# Patient Record
Sex: Female | Born: 1960
Health system: Southern US, Community
[De-identification: ages and names within clinical notes are randomized; demographics above are authoritative.]

## PROBLEM LIST (undated history)

## (undated) DIAGNOSIS — I10 Essential (primary) hypertension: Secondary | ICD-10-CM

## (undated) DIAGNOSIS — M722 Plantar fascial fibromatosis: Secondary | ICD-10-CM

## (undated) DIAGNOSIS — M1990 Unspecified osteoarthritis, unspecified site: Secondary | ICD-10-CM

## (undated) DIAGNOSIS — J45909 Unspecified asthma, uncomplicated: Secondary | ICD-10-CM

## (undated) HISTORY — DX: Unspecified osteoarthritis, unspecified site: M19.90

## (undated) HISTORY — DX: Plantar fascial fibromatosis: M72.2

## (undated) HISTORY — PX: JOINT REPLACEMENT: SHX530

---

## 2010-08-04 ENCOUNTER — Emergency Department: Payer: Self-pay | Admitting: Emergency Medicine

## 2010-10-06 ENCOUNTER — Emergency Department: Payer: Self-pay | Admitting: Internal Medicine

## 2011-01-10 ENCOUNTER — Ambulatory Visit: Payer: Self-pay

## 2011-02-06 ENCOUNTER — Emergency Department: Payer: Self-pay | Admitting: Internal Medicine

## 2012-11-20 ENCOUNTER — Emergency Department: Payer: Self-pay | Admitting: Emergency Medicine

## 2012-11-20 LAB — BASIC METABOLIC PANEL
Anion Gap: 7 (ref 7–16)
BUN: 7 mg/dL (ref 7–18)
Chloride: 106 mmol/L (ref 98–107)
Co2: 27 mmol/L (ref 21–32)
Creatinine: 0.53 mg/dL — ABNORMAL LOW (ref 0.60–1.30)
EGFR (African American): >60
EGFR (Non-African Amer.): >60
Osmolality: 277 (ref 275–301)

## 2012-11-20 LAB — CBC
HCT: 36.8 % (ref 35.0–47.0)
HGB: 12.4 g/dL (ref 12.0–16.0)
MCH: 28.1 pg (ref 26.0–34.0)
MCHC: 33.7 g/dL (ref 32.0–36.0)
MCV: 84 fL (ref 80–100)
Platelet: 225 10*3/uL (ref 150–440)
WBC: 7 10*3/uL (ref 3.6–11.0)

## 2013-08-18 ENCOUNTER — Emergency Department: Payer: Self-pay | Admitting: Internal Medicine

## 2013-08-18 LAB — CBC
HCT: 37.6 % (ref 35.0–47.0)
HGB: 12.6 g/dL (ref 12.0–16.0)
MCH: 27.7 pg (ref 26.0–34.0)
MCHC: 33.5 g/dL (ref 32.0–36.0)
MCV: 83 fL (ref 80–100)
Platelet: 229 10*3/uL (ref 150–440)
RBC: 4.54 10*6/uL (ref 3.80–5.20)
RDW: 15.1 % — ABNORMAL HIGH (ref 11.5–14.5)
WBC: 6.6 10*3/uL (ref 3.6–11.0)

## 2013-08-18 LAB — COMPREHENSIVE METABOLIC PANEL
ALT: 15 U/L (ref 12–78)
Albumin: 3.8 g/dL (ref 3.4–5.0)
Alkaline Phosphatase: 69 U/L
Anion Gap: 3 — ABNORMAL LOW (ref 7–16)
BUN: 6 mg/dL — ABNORMAL LOW (ref 7–18)
Bilirubin,Total: 0.3 mg/dL (ref 0.2–1.0)
CALCIUM: 9 mg/dL (ref 8.5–10.1)
CO2: 31 mmol/L (ref 21–32)
CREATININE: 0.55 mg/dL — AB (ref 0.60–1.30)
Chloride: 107 mmol/L (ref 98–107)
EGFR (African American): >60
EGFR (Non-African Amer.): >60
Glucose: 83 mg/dL (ref 65–99)
OSMOLALITY: 278 (ref 275–301)
Potassium: 3.6 mmol/L (ref 3.5–5.1)
SGOT(AST): 10 U/L — ABNORMAL LOW (ref 15–37)
SODIUM: 141 mmol/L (ref 136–145)
Total Protein: 7.7 g/dL (ref 6.4–8.2)

## 2013-08-18 LAB — TROPONIN I

## 2013-10-08 ENCOUNTER — Emergency Department: Payer: Self-pay | Admitting: Emergency Medicine

## 2013-10-19 ENCOUNTER — Emergency Department: Payer: Self-pay | Admitting: Emergency Medicine

## 2013-10-19 LAB — CBC WITH DIFFERENTIAL/PLATELET
Basophil #: 0.1 10*3/uL (ref 0.0–0.1)
Basophil %: 0.7 %
EOS ABS: 0.2 10*3/uL (ref 0.0–0.7)
EOS PCT: 1.8 %
HCT: 37.1 % (ref 35.0–47.0)
HGB: 12.5 g/dL (ref 12.0–16.0)
LYMPHS ABS: 2.5 10*3/uL (ref 1.0–3.6)
Lymphocyte %: 24.8 %
MCH: 28.3 pg (ref 26.0–34.0)
MCHC: 33.8 g/dL (ref 32.0–36.0)
MCV: 84 fL (ref 80–100)
MONO ABS: 0.9 x10 3/mm (ref 0.2–0.9)
Monocyte %: 9 %
Neutrophil #: 6.3 10*3/uL (ref 1.4–6.5)
Neutrophil %: 63.7 %
PLATELETS: 273 10*3/uL (ref 150–440)
RBC: 4.43 10*6/uL (ref 3.80–5.20)
RDW: 14.9 % — ABNORMAL HIGH (ref 11.5–14.5)
WBC: 9.9 10*3/uL (ref 3.6–11.0)

## 2013-10-19 LAB — BASIC METABOLIC PANEL
Anion Gap: 3 — ABNORMAL LOW (ref 7–16)
BUN: 8 mg/dL (ref 7–18)
CALCIUM: 9.1 mg/dL (ref 8.5–10.1)
CHLORIDE: 104 mmol/L (ref 98–107)
CO2: 31 mmol/L (ref 21–32)
CREATININE: 0.55 mg/dL — AB (ref 0.60–1.30)
EGFR (Non-African Amer.): >60
GLUCOSE: 85 mg/dL (ref 65–99)
OSMOLALITY: 273 (ref 275–301)
Potassium: 3.7 mmol/L (ref 3.5–5.1)
SODIUM: 138 mmol/L (ref 136–145)

## 2014-05-05 ENCOUNTER — Other Ambulatory Visit: Payer: Self-pay | Admitting: Adult Health

## 2015-03-28 DIAGNOSIS — M79672 Pain in left foot: Secondary | ICD-10-CM | POA: Insufficient documentation

## 2015-03-28 NOTE — ED Notes (Signed)
Patient ambulatory to triage with steady gait, without difficulty or distress noted; pt st plantar fascitis pain to left foot; no injury; +PP, brisk cap refill, W&D, no swelling

## 2015-03-29 ENCOUNTER — Emergency Department
Admission: EM | Admit: 2015-03-29 | Discharge: 2015-03-29 | Discharge status: Against Medical Advice | Payer: Medicaid Other | Attending: Emergency Medicine | Admitting: Emergency Medicine

## 2015-07-23 ENCOUNTER — Encounter: Payer: Self-pay | Admitting: Emergency Medicine

## 2015-07-23 ENCOUNTER — Emergency Department
Admission: EM | Admit: 2015-07-23 | Discharge: 2015-07-23 | Disposition: A | Discharge status: Home / Self Care | Payer: Medicaid Other | Attending: Emergency Medicine | Admitting: Emergency Medicine

## 2015-07-23 DIAGNOSIS — H5500 Unspecified nystagmus: Secondary | ICD-10-CM | POA: Insufficient documentation

## 2015-07-23 DIAGNOSIS — R110 Nausea: Secondary | ICD-10-CM | POA: Diagnosis not present

## 2015-07-23 DIAGNOSIS — R42 Dizziness and giddiness: Secondary | ICD-10-CM | POA: Diagnosis present

## 2015-07-23 DIAGNOSIS — Z760 Encounter for issue of repeat prescription: Secondary | ICD-10-CM | POA: Diagnosis not present

## 2015-07-23 DIAGNOSIS — I10 Essential (primary) hypertension: Secondary | ICD-10-CM | POA: Insufficient documentation

## 2015-07-23 DIAGNOSIS — Z87891 Personal history of nicotine dependence: Secondary | ICD-10-CM | POA: Diagnosis not present

## 2015-07-23 DIAGNOSIS — E876 Hypokalemia: Secondary | ICD-10-CM | POA: Diagnosis not present

## 2015-07-23 HISTORY — DX: Unspecified asthma, uncomplicated: J45.909

## 2015-07-23 HISTORY — DX: Essential (primary) hypertension: I10

## 2015-07-23 LAB — URINALYSIS COMPLETE WITH MICROSCOPIC (ARMC ONLY)
BILIRUBIN URINE: NEGATIVE
Bacteria, UA: NONE SEEN
GLUCOSE, UA: NEGATIVE mg/dL
Hgb urine dipstick: NEGATIVE
Ketones, ur: NEGATIVE mg/dL
Leukocytes, UA: NEGATIVE
Nitrite: NEGATIVE
PH: 6 (ref 5.0–8.0)
Protein, ur: NEGATIVE mg/dL
Specific Gravity, Urine: 1.023 (ref 1.005–1.030)

## 2015-07-23 LAB — CBC
HCT: 38.9 % (ref 35.0–47.0)
HEMOGLOBIN: 13 g/dL (ref 12.0–16.0)
MCH: 28.7 pg (ref 26.0–34.0)
MCHC: 33.4 g/dL (ref 32.0–36.0)
MCV: 86 fL (ref 80.0–100.0)
PLATELETS: 223 10*3/uL (ref 150–440)
RBC: 4.53 MIL/uL (ref 3.80–5.20)
RDW: 14.7 % — ABNORMAL HIGH (ref 11.5–14.5)
WBC: 5.4 10*3/uL (ref 3.6–11.0)

## 2015-07-23 LAB — BASIC METABOLIC PANEL
Anion gap: 9 (ref 5–15)
BUN: 10 mg/dL (ref 6–20)
CHLORIDE: 102 mmol/L (ref 101–111)
CO2: 30 mmol/L (ref 22–32)
CREATININE: 0.61 mg/dL (ref 0.44–1.00)
Calcium: 9.6 mg/dL (ref 8.9–10.3)
GFR calc non Af Amer: >60 mL/min (ref >60)
GLUCOSE: 86 mg/dL (ref 65–99)
Potassium: 3.2 mmol/L — ABNORMAL LOW (ref 3.5–5.1)
Sodium: 141 mmol/L (ref 135–145)

## 2015-07-23 MED ORDER — MECLIZINE HCL 25 MG PO TABS
25.0000 mg | ORAL_TABLET | Freq: Once | ORAL | Status: AC
  Administered 2015-07-23: 25 mg via ORAL

## 2015-07-23 MED ORDER — ONDANSETRON 4 MG PO TBDP: 4.0000 mg | ORAL_TABLET | Freq: Three times a day (TID) | ORAL | Status: DC | PRN

## 2015-07-23 MED ORDER — MECLIZINE HCL 25 MG PO TABS: 25.0000 mg | ORAL_TABLET | Freq: Four times a day (QID) | ORAL | Status: DC | PRN

## 2015-07-23 MED ORDER — ONDANSETRON 4 MG PO TBDP
4.0000 mg | ORAL_TABLET | Freq: Once | ORAL | Status: AC
  Administered 2015-07-23: 4 mg via ORAL
  Filled 2015-07-23: qty 1

## 2015-07-23 MED ORDER — MECLIZINE HCL 25 MG PO TABS
ORAL_TABLET | ORAL | Status: AC
  Filled 2015-07-23: qty 1

## 2015-07-23 MED ORDER — POTASSIUM CHLORIDE CRYS ER 20 MEQ PO TBCR
40.0000 meq | EXTENDED_RELEASE_TABLET | Freq: Once | ORAL | Status: AC
  Administered 2015-07-23: 40 meq via ORAL
  Filled 2015-07-23 (×2): qty 2

## 2015-07-23 NOTE — ED Notes (Signed)
Was in [place] yesterday and "got sick".   Patient's blood pressure was elevate had an EKG and a CT Scan.  Diagnosed with vertigo.  Patient was given RX for Meclizine, Zofran, and Amlodipine.  [facility]  And went to have RX's filled.  Medications were too expensive, so patient filled amlodipine.  Today patient feeling dizzy and nauseated.  Patient started blood pressure medications today.

## 2015-07-23 NOTE — ED Notes (Signed)
AAOx3.  Skin warm and dry.  Moving all extremities equally and strong. Ambulates with easy and steady gait.   

## 2015-07-23 NOTE — Discharge Instructions (Signed)
Please return to the emergency department if you develop dizziness, falls, vomiting, fever, numbness tingling or weakness, changes in speech or vision, confusion, or any other symptoms concerning to you.

## 2015-07-23 NOTE — ED Provider Notes (Signed)
[facility] Emergency Department Provider Note  ____________________________________________  Time seen: Approximately 2:59 PM  I have reviewed the triage vital signs and the nursing notes.   HISTORY  Chief Complaint Dizziness and Nausea    HPI Rachel Cross is a 54 y.o. female with a history of untreated hypertension presenting for medication refill. Patient reports that yesterday she was in [place] at the opening of a casino when she got up in the middle the night and had the acute onset of a "room spinning" dizziness associated with nausea but no vomiting. It was worse with movements, especially bending forward. She was unable to walk due to instability from the dizziness so she went to the local emergency department where she had a reportedly reassuring EKG and negative CT head without contrast. She was discharged with amlodipine as her blood pressure was greater than 200, as well as meclizine and anti-emetic because those medications resolved her symptoms in the ER. She filled her antihypertensive prescription in [place]. She traveled back to [place] today and was unable to fill her prescriptions because they are from out of state. Her symptoms are slightly better today, and unchanged in character. She has nausea without vomiting, some positional dizziness, but denies any confusion, changes in vision or speech, numbness tingling or weakness. No recent trauma, headache, chest pain or palpitations, shortness of breath.She does report a history of sinusitis 1 month ago which she treated with "home remedies."   Past Medical History  Diagnosis Date  . Hypertension   . Asthma     There are no active problems to display for this patient.   Past Surgical History  Procedure Laterality Date  . Joint replacement      2 knee surgeries to right knee  . Cesarean section      Current Outpatient Rx  Name  Route  Sig  Dispense  Refill  . meclizine  (ANTIVERT) 25 MG tablet   Oral   Take 1 tablet (25 mg total) by mouth every 6 (six) hours as needed for dizziness.   15 tablet   0   . ondansetron (ZOFRAN ODT) 4 MG disintegrating tablet   Oral   Take 1 tablet (4 mg total) by mouth every 8 (eight) hours as needed for nausea or vomiting.   20 tablet   0     Allergies Review of patient's allergies indicates no known allergies.  No family history on file.  Social History Social History  Substance Use Topics  . Smoking status: Former Games developer  . Smokeless tobacco: None  . Alcohol Use: No    Review of Systems Constitutional: No fever/chills. No lightheadedness. No syncope. Eyes: No visual changes. No double vision or blurred vision. ENT: No sore throat. Cardiovascular: Denies chest pain, palpitations. Respiratory: Denies shortness of breath.  No cough. Gastrointestinal: No abdominal pain.  Positive nausea, no vomiting.  No diarrhea.  No constipation. Genitourinary: Negative for dysuria. Musculoskeletal: Negative for back pain. Skin: Negative for rash. Neurological: Negative for headaches, focal weakness or numbness. Positive for dizziness. No tingling. No changes in speech. No confusion.  10-point ROS otherwise negative.  ____________________________________________   PHYSICAL EXAM:  VITAL SIGNS: ED Triage Vitals  Enc Vitals Group     BP 07/23/15 1208 168/94 mmHg     Pulse Rate 07/23/15 1208 90     Resp 07/23/15 1208 18     Temp 07/23/15 1208 97.9 F (36.6 C)     Temp Source 07/23/15 1208 Oral  SpO2 07/23/15 1208 100 %     Weight 07/23/15 1208 180 lb (81.647 kg)     Height 07/23/15 1208  (1.626 m)     Head Cir --      Peak Flow --      Pain Score 07/23/15 1210 0     Pain Loc --      Pain Edu? --      Excl. in GC? --     Constitutional: Alert and oriented. Well appearing and in no acute distress. Answer question appropriately. Eyes: Conjunctivae are normal.  EOMI. PERRLA.  Head: Atraumatic. No  frontal or maxillary tenderness to palpation. EARS: TMs are clear without bulge, erythema or fluid bilaterally. Canals are clear as well. Nose: No congestion/rhinnorhea. Mouth/Throat: Mucous membranes are moist.  Neck: No stridor.  Supple.  Trachea is midline. No JVD. Cardiovascular: Normal rate, regular rhythm. No murmurs, rubs or gallops.  Respiratory: Normal respiratory effort.  No retractions. Lungs CTAB.  No wheezes, rales or ronchi. Gastrointestinal: Soft and nontender. No distention. No peritoneal signs. Musculoskeletal: No LE edema.  Neurologic: Alert and oriented 3. Speech is clear. Face and smile symmetric. EOMI, PERRLA day. 2 beats of nystagmus which extinguish horizontally with left lateral gaze and mild worsening of symptoms with this.  No pronator drift. 5 out of 5 grip, biceps, triceps, hip flexors, plantar flexion and dorsiflexion. Normal sensation to light touch in the bilateral upper and lower extremities, and face. Normal gait without instability or ataxia. Skin:  Skin is warm, dry and intact. No rash noted. Psychiatric: Mood and affect are normal. Speech and behavior are normal.  Normal judgement.  ____________________________________________   LABS (all labs ordered are listed, but only abnormal results are displayed)  Labs Reviewed  BASIC METABOLIC PANEL - Abnormal; Notable for the following:    Potassium 3.2 (*)    All other components within normal limits  CBC - Abnormal; Notable for the following:    RDW 14.7 (*)    All other components within normal limits  URINALYSIS COMPLETEWITH MICROSCOPIC ([facility] ONLY) - Abnormal; Notable for the following:    Color, Urine YELLOW (*)    APPearance CLEAR (*)    Squamous Epithelial / LPF 0-5 (*)    All other components within normal limits   ____________________________________________  EKG  ED ECG REPORT I, Rockne Menghini, the attending physician, personally viewed and interpreted this ECG.   Date:  07/23/2015  EKG Time: 1223  Rate: 85  Rhythm: normal sinus rhythm  Axis: Normal  Intervals:none  ST&T Change: No ST elevation.  ____________________________________________  RADIOLOGY  No results found.  ____________________________________________   PROCEDURES  Procedure(s) performed: None  Critical Care performed: No ____________________________________________   INITIAL IMPRESSION / ASSESSMENT AND PLAN / ED COURSE  Pertinent labs & imaging results that were available during my care of the patient were reviewed by me and considered in my medical decision making (see chart for details).  54 y.o. female with undertreated hypertension which has improved today after going back on her antihypertensive presenting with vertiginous symptoms that are improved but still present after extensive negative workup reported in an outside emergency Department. On my exam, the patient has minimal nystagmus with leftward gaze, she does not have any ataxia. It is unlikely that she is having an acute stroke and at this time imaging with contrast to evaluate for posterior stroke is not indicated. I will plan to discharge her with [place] her vertigo symptoms. She understands return precautions as well as follow-up instructions.  ____________________________________________  FINAL CLINICAL IMPRESSION(S) / ED DIAGNOSES  Final diagnoses:  Hypokalemia  Vertigo  Encounter for medication refill      NEW MEDICATIONS STARTED DURING THIS VISIT:  New Prescriptions   MECLIZINE (ANTIVERT) 25 MG TABLET    Take 1 tablet (25 mg total) by mouth every 6 (six) hours as needed for dizziness.   ONDANSETRON (ZOFRAN ODT) 4 MG DISINTEGRATING TABLET    Take 1 tablet (4 mg total) by mouth every 8 (eight) hours as needed for nausea or vomiting.     Rockne MenghiniAnne-Caroline Ettore Trebilcock, MD 07/23/15 [id number]

## 2015-08-02 ENCOUNTER — Other Ambulatory Visit: Payer: Self-pay | Admitting: Family Medicine

## 2015-08-02 DIAGNOSIS — Z1231 Encounter for screening mammogram for malignant neoplasm of breast: Secondary | ICD-10-CM

## 2016-05-29 ENCOUNTER — Emergency Department
Admission: EM | Admit: 2016-05-29 | Discharge: 2016-05-29 | Disposition: A | Discharge status: Home / Self Care | Payer: Medicaid Other | Attending: Emergency Medicine | Admitting: Emergency Medicine

## 2016-05-29 ENCOUNTER — Encounter: Payer: Self-pay | Admitting: *Deleted

## 2016-05-29 ENCOUNTER — Emergency Department: Payer: Medicaid Other

## 2016-05-29 DIAGNOSIS — M7581 Other shoulder lesions, right shoulder: Secondary | ICD-10-CM

## 2016-05-29 DIAGNOSIS — J45909 Unspecified asthma, uncomplicated: Secondary | ICD-10-CM | POA: Insufficient documentation

## 2016-05-29 DIAGNOSIS — Y929 Unspecified place or not applicable: Secondary | ICD-10-CM | POA: Diagnosis not present

## 2016-05-29 DIAGNOSIS — Z87891 Personal history of nicotine dependence: Secondary | ICD-10-CM | POA: Insufficient documentation

## 2016-05-29 DIAGNOSIS — Y999 Unspecified external cause status: Secondary | ICD-10-CM | POA: Diagnosis not present

## 2016-05-29 DIAGNOSIS — W010XXA Fall on same level from slipping, tripping and stumbling without subsequent striking against object, initial encounter: Secondary | ICD-10-CM | POA: Diagnosis not present

## 2016-05-29 DIAGNOSIS — I10 Essential (primary) hypertension: Secondary | ICD-10-CM | POA: Diagnosis not present

## 2016-05-29 DIAGNOSIS — Y939 Activity, unspecified: Secondary | ICD-10-CM | POA: Diagnosis not present

## 2016-05-29 DIAGNOSIS — Z79899 Other long term (current) drug therapy: Secondary | ICD-10-CM | POA: Diagnosis not present

## 2016-05-29 DIAGNOSIS — M25511 Pain in right shoulder: Secondary | ICD-10-CM | POA: Diagnosis present

## 2016-05-29 DIAGNOSIS — M75101 Unspecified rotator cuff tear or rupture of right shoulder, not specified as traumatic: Secondary | ICD-10-CM | POA: Diagnosis not present

## 2016-05-29 MED ORDER — KETOROLAC TROMETHAMINE 60 MG/2ML IM SOLN
30.0000 mg | Freq: Once | INTRAMUSCULAR | Status: AC
  Administered 2016-05-29: 30 mg via INTRAMUSCULAR
  Filled 2016-05-29: qty 2

## 2016-05-29 MED ORDER — TRAMADOL HCL 50 MG PO TABS: 50.0000 mg | ORAL_TABLET | Freq: Four times a day (QID) | ORAL | 0 refills | Status: DC | PRN

## 2016-05-29 MED ORDER — MELOXICAM 15 MG PO TABS: 15.0000 mg | ORAL_TABLET | Freq: Every day | ORAL | 0 refills | Status: DC

## 2016-05-29 NOTE — Discharge Instructions (Signed)
Please take medications as prescribed and follow-up with orthopedics in no improvement in 5-7 days. Return to the ER for any worsening symptoms urgent changes in her health.

## 2016-05-29 NOTE — ED Notes (Signed)
Right shoulder pain after trip and fall. Pt alert and oriented X4, active, cooperative, pt in NAD. RR even and unlabored, color WNL.  Ambulatory to room.

## 2016-05-29 NOTE — ED Triage Notes (Signed)
Pt tripped and fell , landed on right shoulder/arm, pt complains of right shoulder arm pain

## 2016-05-29 NOTE — ED Provider Notes (Signed)
[facility]-EMERGENCY DEPARTMENT Provider Note   CSN: [id number] Arrival date & time: 05/29/16  1744     History   Chief Complaint Chief Complaint  Patient presents with  . Shoulder Pain    HPI Rachel BeaverStephanie Cross is a 55 y.o. female presents to the emergency department for evaluation of right shoulder pain. Patient states she had a fall 2 weeks ago landed on her right side. She says continued shoulder pain in the right posterior lateral shoulder. She denies any numbness tingling describes the pain as a deep throbbing ache. Pain is increased with shoulder range of motion denies any increase in pain with neck range of motion. Patient was treated initially by urgent care facility with baclofen, 60 steroid taper. She had no improvement. She did not have any x-rays. She has been taking baclofen and Tylenol with no improvement. Her pain is 8 out of 10. She denies any weakness in the right upper extremity. Pain does not go below the elbow. She denies any neck pain or headache.  HPI  Past Medical History:  Diagnosis Date  . Asthma   . Hypertension     There are no active problems to display for this patient.   Past Surgical History:  Procedure Laterality Date  . CESAREAN SECTION    . JOINT REPLACEMENT     2 knee surgeries to right knee    OB History    No data available       Home Medications    Prior to Admission medications   Medication Sig Start Date End Date Taking? Authorizing Provider  meclizine (ANTIVERT) 25 MG tablet Take 1 tablet (25 mg total) by mouth every 6 (six) hours as needed for dizziness. 07/23/15   Anne-Caroline Sharma CovertNorman, MD  meloxicam (MOBIC) 15 MG tablet Take 1 tablet (15 mg total) by mouth daily. 05/29/16   Evon Slackhomas C Aariyah Sampey, PA-C  ondansetron (ZOFRAN ODT) 4 MG disintegrating tablet Take 1 tablet (4 mg total) by mouth every 8 (eight) hours as needed for nausea or vomiting. 07/23/15   Rockne MenghiniAnne-Caroline Norman, MD  traMADol (ULTRAM) 50 MG tablet Take 1 tablet (50 mg  total) by mouth every 6 (six) hours as needed. 05/29/16   Evon Slackhomas C Ndidi Nesby, PA-C    Family History No family history on file.  Social History Social History  Substance Use Topics  . Smoking status: Former Games developermoker  . Smokeless tobacco: Not on file  . Alcohol use No     Allergies   Review of patient's allergies indicates no known allergies.   Review of Systems Review of Systems  Constitutional: Negative for chills and fever.  HENT: Negative for ear pain and sore throat.   Eyes: Negative for pain and visual disturbance.  Respiratory: Negative for cough and shortness of breath.   Cardiovascular: Negative for chest pain and palpitations.  Gastrointestinal: Negative for abdominal pain and vomiting.  Genitourinary: Negative for dysuria and hematuria.  Musculoskeletal: Positive for arthralgias. Negative for back pain.  Skin: Negative for color change and rash.  Neurological: Negative for seizures and syncope.  All other systems reviewed and are negative.    Physical Exam Updated Vital Signs BP (!) 150/85 (BP Location: Right Arm)   Pulse 93   Temp 98.4 F (36.9 C) (Oral)   Resp 20   Ht 5\' 4"  (1.626 m)   Wt 81.6 kg   LMP 05/16/2016   SpO2 98%   BMI 30.90 kg/m   Physical Exam  Constitutional: She appears well-developed and well-nourished. No distress.  HENT:  Head: Normocephalic and atraumatic.  Eyes: Conjunctivae are normal.  Neck: Normal range of motion. Neck supple.  Cardiovascular: Normal rate and regular rhythm.   No murmur heard. Pulmonary/Chest: Effort normal and breath sounds normal. No respiratory distress.  Abdominal: Soft. There is no tenderness.  Musculoskeletal:  Examination of the cervical spine shows patient has no spinous process tenderness. She has a negative Spurling sign. She has full range of motion of cervical spine.  Examination of the right upper extremity shows patient has a slightly positive Hawkins, impingement sign. She has good internal and  external rotation. She has full active and passive range of motion of the shoulder. She has no weakness with supraspinatus, biceps, triceps, grip strength.  Neurological: She is alert.  Skin: Skin is warm and dry.  Psychiatric: She has a normal mood and affect.  Nursing note and vitals reviewed.    ED Treatments / Results  Labs (all labs ordered are listed, but only abnormal results are displayed) Labs Reviewed - No data to display  EKG  EKG Interpretation None       Radiology Dg Shoulder Right  Result Date: 05/29/2016 CLINICAL DATA:  Right shoulder pain after fall. EXAM: RIGHT SHOULDER - 2+ VIEW COMPARISON:  None. FINDINGS: No fracture or dislocation. Glenohumeral and acromioclavicular joints bases appear preserved. No evidence of calcific tendinitis. Regional soft tissues appear normal. Limited visualization of the adjacent thorax is normal. IMPRESSION: No fracture or dislocation. Electronically Signed   By: Simonne Come M.D.   On: 05/29/2016 19:10    Procedures Procedures (including critical care time)  Medications Ordered in ED Medications  ketorolac (TORADOL) injection 30 mg (30 mg Intramuscular Given 05/29/16 1837)     Initial Impression / Assessment and Plan / ED Course  I have reviewed the triage vital signs and the nursing notes.  Pertinent labs & imaging results that were available during my care of the patient were reviewed by me and considered in my medical decision making (see chart for details).  Clinical Course    55 year old female with continued right shoulder pain. X-ray showed no evidence of acute bony abnormality. Patient has positive impingement signs. She is given a prescription for meloxicam, tramadol. He'll follow-up with orthopedics if no improvement in 7-10 days. Patient did see moderate improvement with Toradol injection, 30 mg IM in the emergency department.  Final Clinical Impressions(s) / ED Diagnoses   Final diagnoses:  Acute pain of  right shoulder  Rotator cuff tendonitis, right    New Prescriptions New Prescriptions   MELOXICAM (MOBIC) 15 MG TABLET    Take 1 tablet (15 mg total) by mouth daily.   TRAMADOL (ULTRAM) 50 MG TABLET    Take 1 tablet (50 mg total) by mouth every 6 (six) hours as needed.     Evon Slack, PA-C 05/29/16 1926    Nita Sickle, MD 05/29/16 [phone]

## 2016-08-21 ENCOUNTER — Encounter: Payer: Self-pay | Admitting: *Deleted

## 2016-08-21 ENCOUNTER — Emergency Department
Admission: EM | Admit: 2016-08-21 | Discharge: 2016-08-21 | Disposition: A | Discharge status: Home / Self Care | Payer: Medicaid Other | Attending: Emergency Medicine | Admitting: Emergency Medicine

## 2016-08-21 DIAGNOSIS — M791 Myalgia: Secondary | ICD-10-CM | POA: Diagnosis not present

## 2016-08-21 DIAGNOSIS — R112 Nausea with vomiting, unspecified: Secondary | ICD-10-CM | POA: Diagnosis not present

## 2016-08-21 DIAGNOSIS — J45909 Unspecified asthma, uncomplicated: Secondary | ICD-10-CM | POA: Diagnosis not present

## 2016-08-21 DIAGNOSIS — I10 Essential (primary) hypertension: Secondary | ICD-10-CM | POA: Insufficient documentation

## 2016-08-21 DIAGNOSIS — R6889 Other general symptoms and signs: Secondary | ICD-10-CM

## 2016-08-21 DIAGNOSIS — R509 Fever, unspecified: Secondary | ICD-10-CM | POA: Insufficient documentation

## 2016-08-21 DIAGNOSIS — Z87891 Personal history of nicotine dependence: Secondary | ICD-10-CM | POA: Insufficient documentation

## 2016-08-21 MED ORDER — HYDROCOD POLST-CPM POLST ER 10-8 MG/5ML PO SUER
5.0000 mL | Freq: Once | ORAL | Status: AC
  Administered 2016-08-21: 5 mL via ORAL
  Filled 2016-08-21: qty 5

## 2016-08-21 MED ORDER — KETOROLAC TROMETHAMINE 60 MG/2ML IM SOLN
60.0000 mg | Freq: Once | INTRAMUSCULAR | Status: AC
  Administered 2016-08-21: 60 mg via INTRAMUSCULAR
  Filled 2016-08-21: qty 2

## 2016-08-21 MED ORDER — IBUPROFEN 600 MG PO TABS: 600.0000 mg | ORAL_TABLET | Freq: Three times a day (TID) | ORAL | 0 refills | Status: DC | PRN

## 2016-08-21 MED ORDER — HYDROCOD POLST-CPM POLST ER 10-8 MG/5ML PO SUER: 5.0000 mL | Freq: Two times a day (BID) | ORAL | 0 refills | Status: DC

## 2016-08-21 MED ORDER — OSELTAMIVIR PHOSPHATE 75 MG PO CAPS: 75.0000 mg | ORAL_CAPSULE | Freq: Two times a day (BID) | ORAL | 0 refills | Status: AC

## 2016-08-21 MED ORDER — ONDANSETRON 4 MG PO TBDP
4.0000 mg | ORAL_TABLET | Freq: Once | ORAL | Status: AC
  Administered 2016-08-21: 4 mg via ORAL
  Filled 2016-08-21: qty 1

## 2016-08-21 MED ORDER — ONDANSETRON HCL 8 MG PO TABS: 8.0000 mg | ORAL_TABLET | Freq: Three times a day (TID) | ORAL | 0 refills | Status: DC | PRN

## 2016-08-21 NOTE — ED Provider Notes (Signed)
[facility] Emergency Department Provider Note   ____________________________________________   First MD Initiated Contact with Patient 08/21/16 1625     (approximate)  I have reviewed the triage vital signs and the nursing notes.   HISTORY  Chief Complaint Fever and Generalized Body Aches    HPI Rachel Cross is a 56 y.o. female patient complaining of fever, bodyaches, chills, and nausea and vomiting. Patient stated onset for 2 days. Patient states symptoms appeared status post exposure to grandson who was diagnosed with flu 3 days ago. Patient denies any diarrhea with this complaint. Patient has not taken a flu shot this season.   Past Medical History:  Diagnosis Date  . Asthma   . Hypertension     There are no active problems to display for this patient.   Past Surgical History:  Procedure Laterality Date  . CESAREAN SECTION    . JOINT REPLACEMENT     2 knee surgeries to right knee    Prior to Admission medications   Medication Sig Start Date End Date Taking? Authorizing Provider  chlorpheniramine-HYDROcodone (TUSSIONEX PENNKINETIC ER) 10-8 MG/5ML SUER Take 5 mLs by mouth 2 (two) times daily. 08/21/16   Joni Reiningonald K Skippy Marhefka, PA-C  ibuprofen (ADVIL,MOTRIN) 600 MG tablet Take 1 tablet (600 mg total) by mouth every 8 (eight) hours as needed. 08/21/16   Joni Reiningonald K Alin Hutchins, PA-C  meclizine (ANTIVERT) 25 MG tablet Take 1 tablet (25 mg total) by mouth every 6 (six) hours as needed for dizziness. 07/23/15   Anne-Caroline Sharma CovertNorman, MD  meloxicam (MOBIC) 15 MG tablet Take 1 tablet (15 mg total) by mouth daily. 05/29/16   Evon Slackhomas C Gaines, PA-C  ondansetron (ZOFRAN ODT) 4 MG disintegrating tablet Take 1 tablet (4 mg total) by mouth every 8 (eight) hours as needed for nausea or vomiting. 07/23/15   Rockne MenghiniAnne-Caroline Norman, MD  ondansetron (ZOFRAN) 8 MG tablet Take 1 tablet (8 mg total) by mouth every 8 (eight) hours as needed for nausea or vomiting. 08/21/16   Joni Reiningonald K  Rushton Early, PA-C  oseltamivir (TAMIFLU) 75 MG capsule Take 1 capsule (75 mg total) by mouth 2 (two) times daily. 08/21/16 08/26/16  Joni Reiningonald K Melvia Matousek, PA-C  traMADol (ULTRAM) 50 MG tablet Take 1 tablet (50 mg total) by mouth every 6 (six) hours as needed. 05/29/16   Evon Slackhomas C Gaines, PA-C    Allergies Patient has no known allergies.  History reviewed. No pertinent family history.  Social History Social History  Substance Use Topics  . Smoking status: Former Games developermoker  . Smokeless tobacco: Never Used  . Alcohol use No    Review of Systems Constitutional: Fevers and chills. Body ache Eyes: No visual changes. ENT: No sore throat. Nasal congestion Cardiovascular: Denies chest pain. Respiratory: Denies shortness of breath. Nonproductive cough Gastrointestinal: No abdominal pain.  No nausea, no vomiting.  No diarrhea.  No constipation. Genitourinary: Negative for dysuria. Musculoskeletal: Negative for back pain. Skin: Negative for rash. Neurological: Positive for headaches, but denies focal weakness or numbness.    ____________________________________________   PHYSICAL EXAM:  VITAL SIGNS: ED Triage Vitals  Enc Vitals Group     BP 08/21/16 1456 (!) 149/84     Pulse Rate 08/21/16 1456 (!) 111     Resp 08/21/16 1456 20     Temp 08/21/16 1456 99.3 F (37.4 C)     Temp Source 08/21/16 1456 Oral     SpO2 08/21/16 1456 100 %     Weight 08/21/16 1457 180 lb (81.6  kg)     Height --      Head Circumference --      Peak Flow --      Pain Score 08/21/16 1457 8     Pain Loc --      Pain Edu? --      Excl. in GC? --     Constitutional: Alert and oriented. Well appearing and in no acute distress. Eyes: Conjunctivae are normal. PERRL. EOMI. Head: Atraumatic. Nose:Edematous nasal turbinates with clear rhinorrhea. Bilateral maxillary guarding.. Mouth/Throat: Mucous membranes are moist.  Oropharynx non-erythematous. Neck: No stridor.  No cervical spine tenderness to  palpation. Hematological/Lymphatic/Immunilogical: No cervical lymphadenopathy. Cardiovascular: Normal rate, regular rhythm. Grossly normal heart sounds.  Good peripheral circulation. Respiratory: Normal respiratory effort.  No retractions. Lungs CTAB. Gastrointestinal: Soft and nontender. No distention. No abdominal bruits. No CVA tenderness. Musculoskeletal: No lower extremity tenderness nor edema.  No joint effusions. Neurologic:  Normal speech and language. No gross focal neurologic deficits are appreciated. No gait instability. Skin:  Skin is warm, dry and intact. No rash noted. Psychiatric: Mood and affect are normal. Speech and behavior are normal.  ____________________________________________   LABS (all labs ordered are listed, but only abnormal results are displayed)  Labs Reviewed - No data to display ____________________________________________  EKG   ____________________________________________  RADIOLOGY   ____________________________________________   PROCEDURES  Procedure(s) performed: None  Procedures  Critical Care performed: No  ____________________________________________   INITIAL IMPRESSION / ASSESSMENT AND PLAN / ED COURSE  Pertinent labs & imaging results that were available during my care of the patient were reviewed by me and considered in my medical decision making (see chart for details).  Viral illness status post positive flu exposure. Patient given discharge care instructions. Patient given a prescription for Tamiflu, ibuprofen, Zofran, and Tussionex. Patient advised to follow up with family doctor condition persists.  Clinical Course      ____________________________________________   FINAL CLINICAL IMPRESSION(S) / ED DIAGNOSES  Final diagnoses:  Flu-like symptoms      NEW MEDICATIONS STARTED DURING THIS VISIT:  Discharge Medication List as of 08/21/2016  4:48 PM    START taking these medications   Details   chlorpheniramine-HYDROcodone (TUSSIONEX PENNKINETIC ER) 10-8 MG/5ML SUER Take 5 mLs by mouth 2 (two) times daily., Starting Wed 08/21/2016, Print    ibuprofen (ADVIL,MOTRIN) 600 MG tablet Take 1 tablet (600 mg total) by mouth every 8 (eight) hours as needed., Starting Wed 08/21/2016, Print    ondansetron (ZOFRAN) 8 MG tablet Take 1 tablet (8 mg total) by mouth every 8 (eight) hours as needed for nausea or vomiting., Starting Wed 08/21/2016, Print    oseltamivir (TAMIFLU) 75 MG capsule Take 1 capsule (75 mg total) by mouth 2 (two) times daily., Starting Wed 08/21/2016, Until Mon 08/26/2016, Print         Note:  This document was prepared using Dragon voice recognition software and may include unintentional dictation errors.    Joni Reining, PA-C 08/21/16 1911    Myrna Blazer, MD 08/21/16 [phone]

## 2016-08-21 NOTE — ED Triage Notes (Signed)
Pt to ed with c/o fever, body aches and chills x 2 days.

## 2017-07-20 IMAGING — CR DG SHOULDER 2+V*R*
1 series · 3 of 3 positions shown · non-contrast
Comparison: None.

CLINICAL DATA: Right shoulder pain after fall.

EXAM:
RIGHT SHOULDER - 2+ VIEW

[Series 1: dg shoulder right · 0.14mm/px · 3 of 3 slices shown]
[im 1/3]
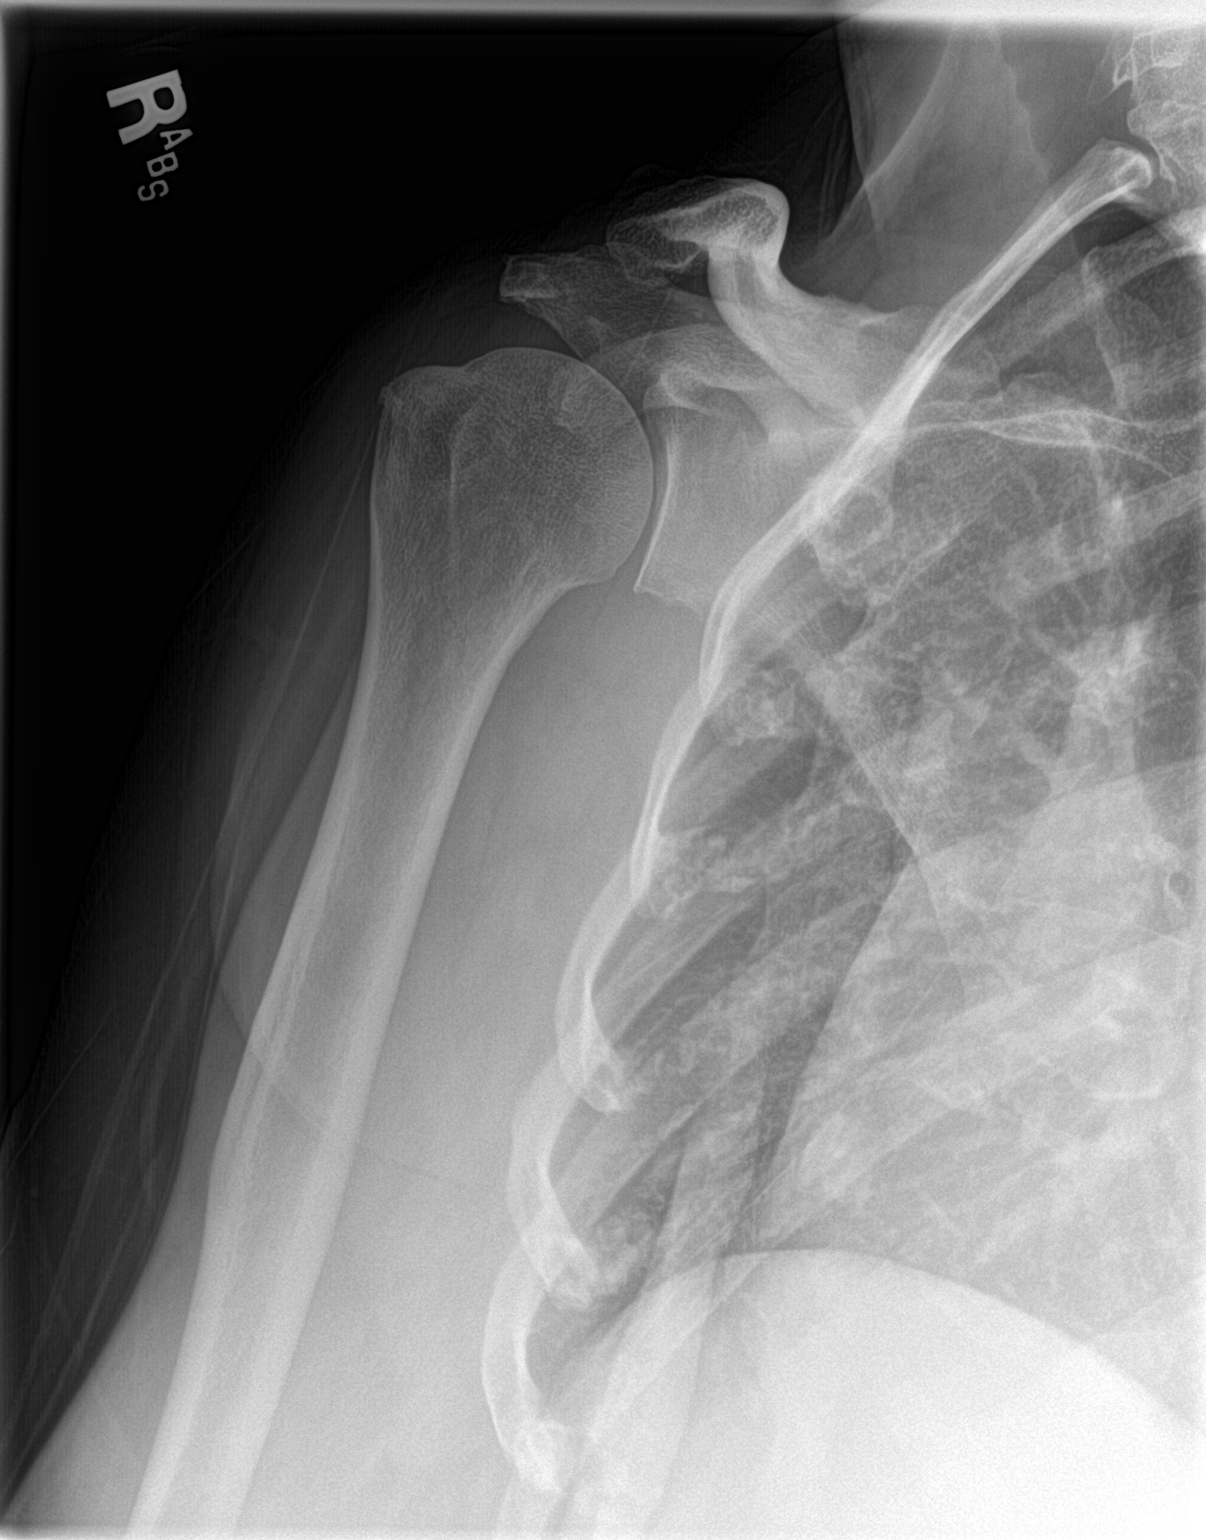
[im 2/3]
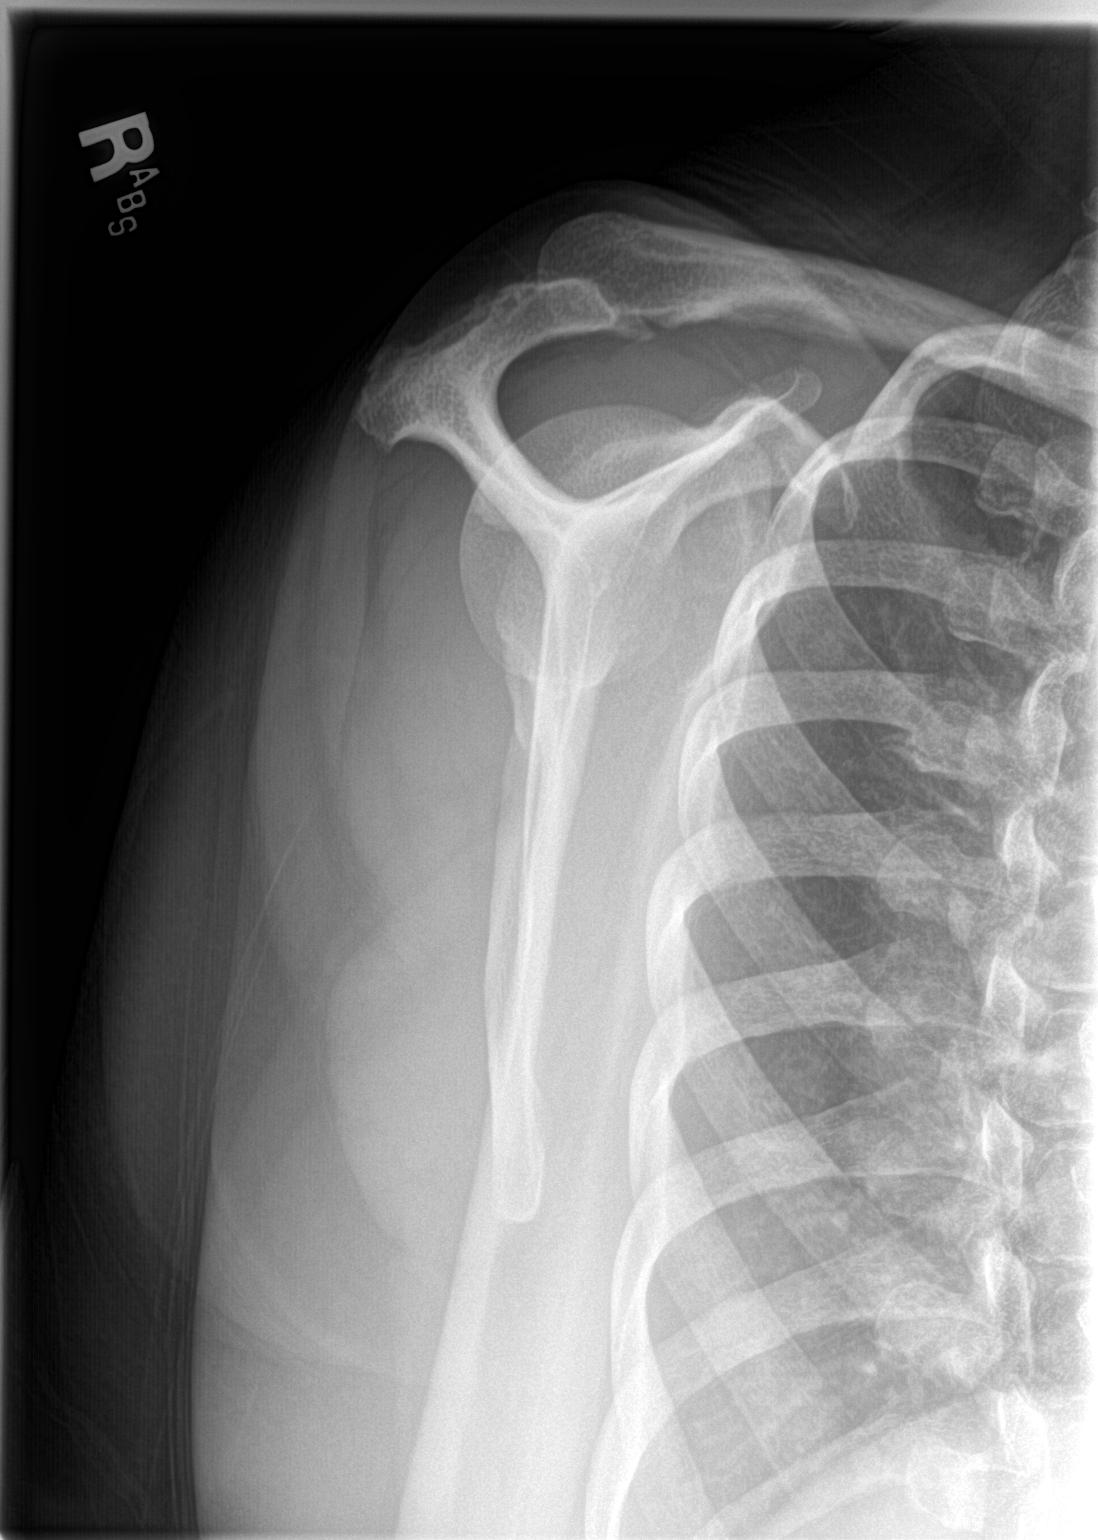
[im 3/3]
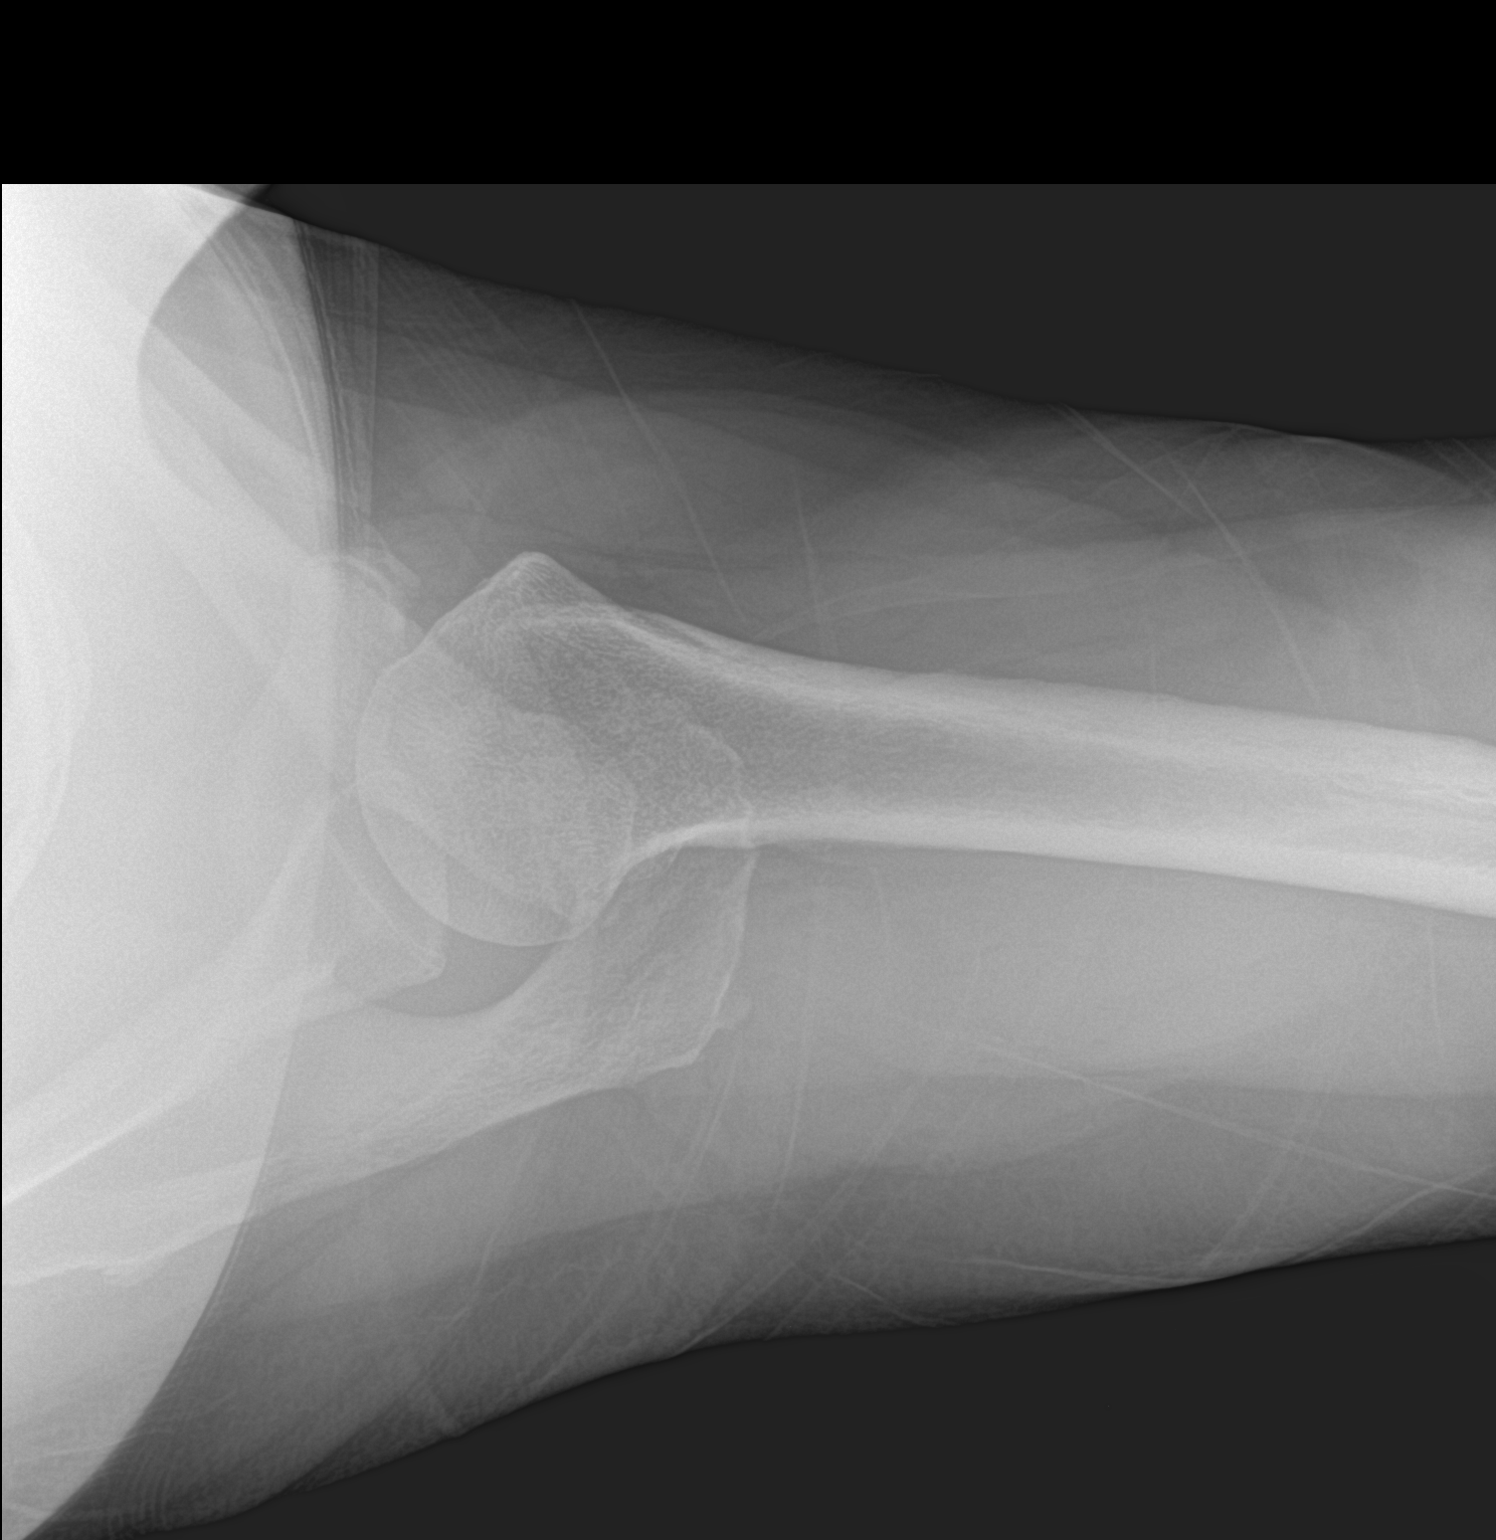

[3 of 3 positions shown; findings below may reference images not displayed]

FINDINGS: No fracture or dislocation. Glenohumeral and acromioclavicular
joints bases appear preserved. No evidence of calcific tendinitis.
Regional soft tissues appear normal. Limited visualization of the
adjacent thorax is normal.
IMPRESSION: No fracture or dislocation.

## 2018-01-02 ENCOUNTER — Other Ambulatory Visit: Payer: Self-pay

## 2018-01-02 DIAGNOSIS — J45909 Unspecified asthma, uncomplicated: Secondary | ICD-10-CM | POA: Insufficient documentation

## 2018-01-02 DIAGNOSIS — K0381 Cracked tooth: Secondary | ICD-10-CM | POA: Insufficient documentation

## 2018-01-02 DIAGNOSIS — Z87891 Personal history of nicotine dependence: Secondary | ICD-10-CM | POA: Insufficient documentation

## 2018-01-02 DIAGNOSIS — Z96651 Presence of right artificial knee joint: Secondary | ICD-10-CM | POA: Insufficient documentation

## 2018-01-02 DIAGNOSIS — Z79899 Other long term (current) drug therapy: Secondary | ICD-10-CM | POA: Insufficient documentation

## 2018-01-02 DIAGNOSIS — I10 Essential (primary) hypertension: Secondary | ICD-10-CM | POA: Insufficient documentation

## 2018-01-02 NOTE — ED Triage Notes (Signed)
Patient states tooth broke while eating.  Pain to left lower jaw.

## 2018-01-02 NOTE — ED Notes (Signed)
Patient to ER for a "cracked tooth." Patient is having significant pain "so that she can't even open her mouth to talk."

## 2018-01-03 ENCOUNTER — Emergency Department
Admission: EM | Admit: 2018-01-03 | Discharge: 2018-01-03 | Disposition: A | Discharge status: Home / Self Care | Payer: Self-pay | Attending: Emergency Medicine | Admitting: Emergency Medicine

## 2018-01-03 DIAGNOSIS — K0381 Cracked tooth: Secondary | ICD-10-CM

## 2018-01-03 MED ORDER — IBUPROFEN 800 MG PO TABS
800.0000 mg | ORAL_TABLET | Freq: Once | ORAL | Status: AC
  Administered 2018-01-03: 800 mg via ORAL
  Filled 2018-01-03: qty 1

## 2018-01-03 MED ORDER — HYDROCODONE-ACETAMINOPHEN 5-325 MG PO TABS: 1.0000 | ORAL_TABLET | Freq: Four times a day (QID) | ORAL | 0 refills | Status: DC | PRN

## 2018-01-03 MED ORDER — BUPIVACAINE HCL (PF) 0.5 % IJ SOLN
INTRAMUSCULAR | Status: AC
  Filled 2018-01-03: qty 30

## 2018-01-03 MED ORDER — IBUPROFEN 600 MG PO TABS: 600.0000 mg | ORAL_TABLET | Freq: Three times a day (TID) | ORAL | 0 refills | Status: DC | PRN

## 2018-01-03 MED ORDER — BUPIVACAINE HCL 0.5 % IJ SOLN
50.0000 mL | Freq: Once | INTRAMUSCULAR | Status: AC
  Administered 2018-01-03: 50 mL

## 2018-01-03 NOTE — ED Provider Notes (Signed)
[facility] Emergency Department Provider Note  ____________________________________________   First MD Initiated Contact with Patient 01/03/18 [phone]     (approximate)  I have reviewed the triage vital signs and the nursing notes.   HISTORY  Chief Complaint No chief complaint on file.    HPI Rachel Cross is a 57 y.o. female who comes to the emergency department with sudden onset severe left lower dental pain after eating dinner tonight and cracking a tooth.  She has not been to a dentist in "a long time".  Pain is severe throbbing aching and radiates from her left jaw to her left ear.  No shortness of breath.  No drooling.  Past Medical History:  Diagnosis Date  . Asthma   . Hypertension     There are no active problems to display for this patient.   Past Surgical History:  Procedure Laterality Date  . CESAREAN SECTION    . JOINT REPLACEMENT     2 knee surgeries to right knee    Prior to Admission medications   Medication Sig Start Date End Date Taking? Authorizing Provider  chlorpheniramine-HYDROcodone (TUSSIONEX PENNKINETIC ER) 10-8 MG/5ML SUER Take 5 mLs by mouth 2 (two) times daily. 08/21/16   Joni Reining, PA-C  HYDROcodone-acetaminophen (NORCO) 5-325 MG tablet Take 1 tablet by mouth every 6 (six) hours as needed for up to 7 doses for severe pain. 01/03/18   Merrily Brittle, MD  ibuprofen (ADVIL,MOTRIN) 600 MG tablet Take 1 tablet (600 mg total) by mouth every 8 (eight) hours as needed. 01/03/18   Merrily Brittle, MD  meclizine (ANTIVERT) 25 MG tablet Take 1 tablet (25 mg total) by mouth every 6 (six) hours as needed for dizziness. 07/23/15   Rockne Menghini, MD  meloxicam (MOBIC) 15 MG tablet Take 1 tablet (15 mg total) by mouth daily. 05/29/16   Evon Slack, PA-C  ondansetron (ZOFRAN ODT) 4 MG disintegrating tablet Take 1 tablet (4 mg total) by mouth every 8 (eight) hours as needed for nausea or vomiting. 07/23/15   Rockne Menghini, MD  ondansetron (ZOFRAN) 8 MG tablet Take 1 tablet (8 mg total) by mouth every 8 (eight) hours as needed for nausea or vomiting. 08/21/16   Joni Reining, PA-C  traMADol (ULTRAM) 50 MG tablet Take 1 tablet (50 mg total) by mouth every 6 (six) hours as needed. 05/29/16   Evon Slack, PA-C    Allergies Patient has no known allergies.  No family history on file.  Social History Social History   Tobacco Use  . Smoking status: Former Games developer  . Smokeless tobacco: Never Used  Substance Use Topics  . Alcohol use: No  . Drug use: Yes    Types: Marijuana    Review of Systems Constitutional: No fever/chills ENT: Positive for dental pain Cardiovascular: Denies chest pain. Respiratory: Denies shortness of breath. Gastrointestinal: No abdominal pain.  No nausea, no vomiting.  No diarrhea.  No constipation. Musculoskeletal: Negative for back pain. Neurological: Negative for headaches   ____________________________________________   PHYSICAL EXAM:  VITAL SIGNS: ED Triage Vitals  Enc Vitals Group     BP 01/02/18 2207 (!) 182/100     Pulse Rate 01/02/18 2207 79     Resp --      Temp 01/02/18 2207 98.2 F (36.8 C)     Temp Source 01/02/18 2207 Oral     SpO2 01/02/18 2207 100 %     Weight 01/02/18 2206 190 lb (86.2 kg)  Height 01/02/18 2206  (1.626 m)     Head Circumference --      Peak Flow --      Pain Score 01/02/18 2206 10     Pain Loc --      Pain Edu? --      Excl. in GC? --     Constitutional: Alert and oriented x4 appears very uncomfortable nontoxic no diaphoresis speaks full clear sentences Head: Atraumatic. Nose: No congestion/rhinnorhea. Mouth/Throat: No trismus uvula midline no pharyngeal erythema or exudate.  Left lower molar cracked and tender Neck: No stridor.   Cardiovascular: Regular rate and rhythm Respiratory: Normal respiratory effort.  No retractions. Neurologic:  Normal speech and language. No gross focal neurologic  deficits are appreciated.  Skin:  Skin is warm, dry and intact. No rash noted.    ____________________________________________  LABS (all labs ordered are listed, but only abnormal results are displayed)  Labs Reviewed - No data to display   __________________________________________  EKG   ____________________________________________  RADIOLOGY   ____________________________________________   DIFFERENTIAL includes but not limited to  Cracked tooth, dental infection, Ludwig angina   PROCEDURES  Procedure(s) performed: Yes  .Nerve Block Date/Time: 01/03/2018 3:00 AM Performed by: Merrily Brittle, MD Authorized by: Merrily Brittle, MD   Consent:    Consent obtained:  Verbal   Consent given by:  Patient   Risks discussed:  Infection, nerve damage, swelling and unsuccessful block Indications:    Indications:  Pain relief Location:    Body area:  Head (Dental block)   Head nerve blocked: Inferior alveolar nerve.   Laterality:  Left Skin anesthesia (see MAR for exact dosages):    Skin anesthesia method:  None Procedure details (see MAR for exact dosages):    Block needle gauge:  25 G   Anesthetic injected:  Bupivacaine 0.5% w/o epi Post-procedure details:    Outcome:  Anesthesia achieved    Critical Care performed: no  Observation: no ____________________________________________   INITIAL IMPRESSION / ASSESSMENT AND PLAN / ED COURSE  Pertinent labs & imaging results that were available during my care of the patient were reviewed by me and considered in my medical decision making (see chart for details).  The patient arrives with an acutely cracked tooth with no evidence of infection.  Verbally consented for dental block and performed with a small amount of bupivacaine without epinephrine with good anesthesia.  I will refer her to a dentist as an outpatient.  Strict return precautions have been given the patient verbalizes understanding agreement the  plan.      ____________________________________________   FINAL CLINICAL IMPRESSION(S) / ED DIAGNOSES  Final diagnoses:  Cracked tooth      NEW MEDICATIONS STARTED DURING THIS VISIT:  Discharge Medication List as of 01/03/2018  3:00 AM    START taking these medications   Details  HYDROcodone-acetaminophen (NORCO) 5-325 MG tablet Take 1 tablet by mouth every 6 (six) hours as needed for up to 7 doses for severe pain., Starting Sat 01/03/2018, Print         Note:  This document was prepared using Dragon voice recognition software and may include unintentional dictation errors.      Merrily Brittle, MD 01/05/18 1044

## 2018-01-03 NOTE — Discharge Instructions (Signed)
Please follow-up with the dentist tomorrow for reevaluation.  Return to the emergency department for any concerns.  It was a pleasure to take care of you today, and thank you for coming to our emergency department.  If you have any questions or concerns before leaving please ask the nurse to grab me and I'm more than happy to go through your aftercare instructions again.  If you were prescribed any opioid pain medication today such as Norco, Vicodin, Percocet, morphine, hydrocodone, or oxycodone please make sure you do not drive when you are taking this medication as it can alter your ability to drive safely.  If you have any concerns once you are home that you are not improving or are in fact getting worse before you can make it to your follow-up appointment, please do not hesitate to call 911 and come back for further evaluation.  Merrily Brittle, MD

## 2020-05-27 ENCOUNTER — Ambulatory Visit: Payer: Self-pay

## 2022-03-12 ENCOUNTER — Encounter: Payer: Self-pay | Admitting: Emergency Medicine

## 2022-03-12 ENCOUNTER — Emergency Department
Admission: EM | Admit: 2022-03-12 | Discharge: 2022-03-12 | Disposition: A | Discharge status: Home / Self Care | Payer: Self-pay | Attending: Emergency Medicine | Admitting: Emergency Medicine

## 2022-03-12 ENCOUNTER — Other Ambulatory Visit: Payer: Self-pay

## 2022-03-12 DIAGNOSIS — I10 Essential (primary) hypertension: Secondary | ICD-10-CM

## 2022-03-12 DIAGNOSIS — Z760 Encounter for issue of repeat prescription: Secondary | ICD-10-CM | POA: Insufficient documentation

## 2022-03-12 DIAGNOSIS — J45909 Unspecified asthma, uncomplicated: Secondary | ICD-10-CM | POA: Insufficient documentation

## 2022-03-12 MED ORDER — AMLODIPINE BESYLATE 10 MG PO TABS: 10.0000 mg | ORAL_TABLET | Freq: Every day | ORAL | 11 refills | Status: DC

## 2022-03-12 MED ORDER — AMLODIPINE BESYLATE 5 MG PO TABS
10.0000 mg | ORAL_TABLET | Freq: Once | ORAL | Status: AC
  Administered 2022-03-12: 10 mg via ORAL
  Filled 2022-03-12: qty 2

## 2022-03-12 NOTE — ED Provider Triage Note (Signed)
  Emergency Medicine Provider Triage Evaluation Note  Rachel Cross , a 61 y.o.female,  was evaluated in triage.  Pt complains of hypertension.  Patient states that she recently ran out of her blood pressure medicine and was feeling sort of " off" yesterday and feeling fatigued.  Denies any chest pain, shortness of breath, or abdominal pain.  She states that she thinks that that she just needs a refill.   Review of Systems  Positive: Elevated blood pressure, fatigue Negative: Denies fever, chest pain, vomiting  Physical Exam  There were no vitals filed for this visit. Gen:   Awake, no distress   Resp:  Normal effort  MSK:   Moves extremities without difficulty  Other:    Medical Decision Making  Given the patient's initial medical screening exam, the following diagnostic evaluation has been ordered. The patient will be placed in the appropriate treatment space, once one is available, to complete the evaluation and treatment. I have discussed the plan of care with the patient and I have advised the patient that an ED physician or mid-level practitioner will reevaluate their condition after the test results have been received, as the results may give them additional insight into the type of treatment they may need.    Diagnostics: EKG.  Treatments: none immediately   Varney Daily, [place] 03/12/22 1546

## 2022-03-12 NOTE — ED Provider Notes (Signed)
   [facility] Provider Note    Event Date/Time   First MD Initiated Contact with Patient 03/12/22 1847     (approximate)  History   Chief Complaint: Headache and Hypertension  HPI  Rachel Cross is a 61 y.o. female with a past medical history of asthma, hypertension, presents to the emergency department for elevated blood pressure.  According to the patient she ran out of her blood pressure medication several days ago since she has noticed a foggy feeling in her head and she was worried that her blood pressure was elevated so she came to the emergency department for evaluation.  Patient denies any chest pain.  Overall the patient appears well, blood pressure upon arrival 161/98.  Physical Exam   Triage Vital Signs: ED Triage Vitals  Enc Vitals Group     BP 03/12/22 1605 (!) 161/98     Pulse Rate 03/12/22 1605 89     Resp 03/12/22 1605 18     Temp 03/12/22 1605 98.4 F (36.9 C)     Temp Source 03/12/22 1605 Oral     SpO2 03/12/22 1605 96 %     Weight 03/12/22 1610 190 lb (86.2 kg)     Height 03/12/22 1610 5\' 4"  (1.626 m)     Head Circumference --      Peak Flow --      Pain Score 03/12/22 1610 0     Pain Loc --      Pain Edu? --      Excl. in GC? --     Most recent vital signs: Vitals:   03/12/22 1605  BP: (!) 161/98  Pulse: 89  Resp: 18  Temp: 98.4 F (36.9 C)  SpO2: 96%    General: Awake, no distress.  CV:  Good peripheral perfusion.  Resp:  Normal effort.  Abd:  No distention.     ED Results / Procedures / Treatments   EKG  EKG viewed and interpreted by myself shows a normal sinus rhythm 86 bpm the narrow QRS, normal axis, normal intervals, no concerning ST changes.  MEDICATIONS ORDERED IN ED: Medications  amLODipine (NORVASC) tablet 10 mg (has no administration in time range)     IMPRESSION / MDM / ASSESSMENT AND PLAN / ED COURSE  I reviewed the triage vital signs and the nursing notes.  Patient's presentation is  most consistent with acute illness / injury with system symptoms.  Patient presents emergency department being out of her blood pressure medicine for the last several days now with head fogginess.  Patient's blood pressure upon arrival 161/98, recheck is 180/98.  We will dose the patient's 10 mg of amlodipine in the emergency department.  Otherwise the patient appears well with no concerning findings, otherwise reassuring vitals.  We will place the patient back on her amlodipine 10 mg daily have the patient follow-up with her primary care doctor.  Patient agreeable to plan of care.  FINAL CLINICAL IMPRESSION(S) / ED DIAGNOSES   Hypertension   Note:  This document was prepared using Dragon voice recognition software and may include unintentional dictation errors.   05/12/22, MD 03/12/22 1900

## 2022-03-12 NOTE — ED Triage Notes (Signed)
Patient arrives by POV ambulatory states she ran out of her BP meds (amlodipine) on Sunday. C/o fatigue, swollen head and light headed onset of yesterday.

## 2022-05-31 ENCOUNTER — Other Ambulatory Visit: Payer: Self-pay

## 2022-05-31 ENCOUNTER — Emergency Department: Payer: Self-pay

## 2022-05-31 ENCOUNTER — Emergency Department
Admission: EM | Admit: 2022-05-31 | Discharge: 2022-05-31 | Disposition: A | Discharge status: Home / Self Care | Payer: Self-pay | Attending: Emergency Medicine | Admitting: Emergency Medicine

## 2022-05-31 DIAGNOSIS — R519 Headache, unspecified: Secondary | ICD-10-CM

## 2022-05-31 DIAGNOSIS — J45909 Unspecified asthma, uncomplicated: Secondary | ICD-10-CM | POA: Insufficient documentation

## 2022-05-31 DIAGNOSIS — I10 Essential (primary) hypertension: Secondary | ICD-10-CM | POA: Insufficient documentation

## 2022-05-31 LAB — BASIC METABOLIC PANEL
Anion gap: 7 (ref 5–15)
BUN: 8 mg/dL (ref 6–20)
CO2: 27 mmol/L (ref 22–32)
Calcium: 9.8 mg/dL (ref 8.9–10.3)
Chloride: 108 mmol/L (ref 98–111)
Creatinine, Ser: 0.61 mg/dL (ref 0.44–1.00)
GFR, Estimated: >60 mL/min (ref >60)
Glucose, Bld: 91 mg/dL (ref 70–99)
Potassium: 3.8 mmol/L (ref 3.5–5.1)
Sodium: 142 mmol/L (ref 135–145)

## 2022-05-31 LAB — CBC WITH DIFFERENTIAL/PLATELET
Abs Immature Granulocytes: 0.01 10*3/uL (ref 0.00–0.07)
Basophils Absolute: 0 10*3/uL (ref 0.0–0.1)
Basophils Relative: 1 %
Eosinophils Absolute: 0.1 10*3/uL (ref 0.0–0.5)
Eosinophils Relative: 2 %
HCT: 41.5 % (ref 36.0–46.0)
Hemoglobin: 13.5 g/dL (ref 12.0–15.0)
Immature Granulocytes: 0 %
Lymphocytes Relative: 43 %
Lymphs Abs: 2.3 10*3/uL (ref 0.7–4.0)
MCH: 27.6 pg (ref 26.0–34.0)
MCHC: 32.5 g/dL (ref 30.0–36.0)
MCV: 84.9 fL (ref 80.0–100.0)
Monocytes Absolute: 0.4 10*3/uL (ref 0.1–1.0)
Monocytes Relative: 8 %
Neutro Abs: 2.5 10*3/uL (ref 1.7–7.7)
Neutrophils Relative %: 46 %
Platelets: 270 10*3/uL (ref 150–400)
RBC: 4.89 MIL/uL (ref 3.87–5.11)
RDW: 14.6 % (ref 11.5–15.5)
WBC: 5.4 10*3/uL (ref 4.0–10.5)
nRBC: 0 % (ref 0.0–0.2)

## 2022-05-31 LAB — TROPONIN I (HIGH SENSITIVITY): Troponin I (High Sensitivity): 5 ng/L (ref <18)

## 2022-05-31 MED ORDER — AMLODIPINE BESYLATE 5 MG PO TABS
10.0000 mg | ORAL_TABLET | Freq: Once | ORAL | Status: AC
  Administered 2022-05-31: 10 mg via ORAL
  Filled 2022-05-31: qty 2

## 2022-05-31 MED ORDER — AMLODIPINE BESYLATE 10 MG PO TABS: 10.0000 mg | ORAL_TABLET | Freq: Every day | ORAL | 11 refills | Status: DC

## 2022-05-31 MED ORDER — IBUPROFEN 400 MG PO TABS
400.0000 mg | ORAL_TABLET | Freq: Once | ORAL | Status: AC
  Administered 2022-05-31: 400 mg via ORAL
  Filled 2022-05-31: qty 1

## 2022-05-31 MED ORDER — ACETAMINOPHEN 500 MG PO TABS
1000.0000 mg | ORAL_TABLET | Freq: Once | ORAL | Status: AC
  Administered 2022-05-31: 1000 mg via ORAL
  Filled 2022-05-31: qty 2

## 2022-05-31 MED ORDER — METOCLOPRAMIDE HCL 10 MG PO TABS
10.0000 mg | ORAL_TABLET | Freq: Once | ORAL | Status: AC
  Administered 2022-05-31: 10 mg via ORAL
  Filled 2022-05-31: qty 1

## 2022-05-31 MED ORDER — AMLODIPINE BESYLATE 10 MG PO TABS: 10.0000 mg | ORAL_TABLET | Freq: Every day | ORAL | 2 refills | Status: DC

## 2022-05-31 NOTE — ED Triage Notes (Signed)
Pt. To ED via POV for HA since last night, blurry vision, and right sided leg swelling. Pt. Denies CP, or SOB or LOC. Pt. States she stopped taking her amlodipine last year.

## 2022-05-31 NOTE — Discharge Instructions (Signed)
Your ultrasound did not show any blood clot in your legs.  Your CAT scan of your brain did not show any significant abnormality.  Please start taking the amlodipine 10 mg daily.  Please check your blood pressure mainly at home so you have a sense for what it is running once you are on the amlodipine and follow-up with your primary doctor as you may need to be started on additional medication.  You can take Tylenol for your headache.  Follow-up any new neurologic symptoms such as numbness or weakness on one side your body please return to the emergency department.

## 2022-05-31 NOTE — ED Provider Triage Note (Signed)
Emergency Medicine Provider Triage Evaluation Note  Rachel Cross , a 61 y.o. female  was evaluated in triage.  Pt complains of Htn. Reports that her right leg is also swollen and she has been needing to use a cane. Reports that she had knee injury in 2004. Reports HA since last night, blurred vision. No CP/SOB. Reprots that she was supposed to be taking BP medication but stopped because "I was fine." She used to take amlodipine, but stopped last year. Reports headache feels throbbing. No head injury. Also reports leg swelling in RLE only for many months. No history of PE/DVT. No skin changes.  Review of Systems  Positive: Headache, blurry vision, leg swelling in RLE Negative: Fever, neck pain, dizzy, abd/back pain  Physical Exam  LMP 11/10/2017 (Approximate)  Gen:   Awake, no distress   Resp:  Normal effort  MSK:   Moves extremities without difficulty  Other:    Medical Decision Making  Medically screening exam initiated at 12:12 PM.  Appropriate orders placed.  Rachel Cross was informed that the remainder of the evaluation will be completed by another provider, this initial triage assessment does not replace that evaluation, and the importance of remaining in the ED until their evaluation is complete.     Marquette Old, PA-C 05/31/22 1218

## 2022-05-31 NOTE — ED Provider Notes (Addendum)
[facility] Provider Note    Event Date/Time   First MD Initiated Contact with Patient 05/31/22 1347     (approximate)   History   Hypertension (Pt. To ED via POV for HA since last night, blurry vision, and right sided leg swelling. Pt. Denies CP, or SOB or LOC. Pt. States she stopped taking her amlodipine last year.) and Leg Swelling   HPI  Rachel Cross is a 61 y.o. female past medical history of asthma and hypertension who presents with headache elevated blood pressure and leg pain.  Patient started having headache today located in the front of her head described as throbbing.  Was gradual in onset not maximal in onset.  Noticed some intermittent blurred vision as well.  Thought her blood pressure to be elevated because of the high blood pressure.  She also endorses pain in the right lower extremity for the last several days denies any preceding trauma.  Notes that she typically has some pain in that leg because of prior surgery but that today it seemed swollen.  Denies any chest pain shortness of breath no numbness tingling weakness no neck pain no trauma.  Patient was on amlodipine in the past but has not been taking it for several years because she felt better.     Past Medical History:  Diagnosis Date   Asthma    Hypertension     There are no problems to display for this patient.    Physical Exam  Triage Vital Signs: ED Triage Vitals  Enc Vitals Group     BP 05/31/22 1216 (!) 190/106     Pulse Rate 05/31/22 1216 87     Resp 05/31/22 1216 18     Temp 05/31/22 1216 98.2 F (36.8 C)     Temp Source 05/31/22 1216 Oral     SpO2 05/31/22 1216 97 %     Weight 05/31/22 1217 210 lb (95.3 kg)     Height 05/31/22 1217 5\' 4"  (1.626 m)     Head Circumference --      Peak Flow --      Pain Score 05/31/22 1217 9     Pain Loc --      Pain Edu? --      Excl. in [place]? --     Most recent vital signs: Vitals:   05/31/22 1422 05/31/22 1423  BP: (!)  186/101   Pulse:  81  Resp:    Temp:    SpO2:  100%     General: Awake, no distress.  CV:  Good peripheral perfusion.  Resp:  Normal effort.  Abd:  No distention.  Neuro:             Awake, Alert, Oriented x 3  Other:  Aox3, nml speech  PERRL, EOMI, face symmetric, nml tongue movement  5/5 strength in the BL upper and lower extremities  Sensation grossly intact in the BL upper and lower extremities  Finger-nose-finger intact BL  No significant swelling of bilateral lower extremities, 2+ DP pulses bilaterally, no focal tenderness of the right lower extremity compartment soft   ED Results / Procedures / Treatments  Labs (all labs ordered are listed, but only abnormal results are displayed) Labs Reviewed  CBC WITH DIFFERENTIAL/PLATELET  BASIC METABOLIC PANEL  TROPONIN I (HIGH SENSITIVITY)     EKG  EKG interpretation performed by myself: NSR, nml axis, nml intervals, no acute ischemic changes, biphasic T waves V4 through V6  RADIOLOGY I reviewed and interpreted the CT scan of the brain which does not show any acute intracranial process    PROCEDURES:  Critical Care performed: No  Procedures    MEDICATIONS ORDERED IN ED: Medications  acetaminophen (TYLENOL) tablet 1,000 mg (1,000 mg Oral Given 05/31/22 1421)  ibuprofen (ADVIL) tablet 400 mg (400 mg Oral Given 05/31/22 1421)  amLODipine (NORVASC) tablet 10 mg (10 mg Oral Given 05/31/22 1421)  metoCLOPramide (REGLAN) tablet 10 mg (10 mg Oral Given 05/31/22 1422)     IMPRESSION / MDM / ASSESSMENT AND PLAN / ED COURSE  I reviewed the triage vital signs and the nursing notes.                              Patient's presentation is most consistent with acute presentation with potential threat to life or bodily function.  Differential diagnosis includes, but is not limited to, asymptomatic hypertension, migraine headache, tension headache, less likely hypertensive emergency, hypertensive encephalopathy,  intracranial hemorrhage, CVA  Patient is a 61 year old female with prior diagnosis of hypertension that is untreated who presents with several issues today.  Started with headache today that was gradual onset not maximal onset associated some blurred vision but no other neurologic symptoms.  She also has had several days of atraumatic right lower extremity pain felt Swollen today.  She has no chest pain or dyspnea.  Blood pressure 190/100 rest of her vitals are reassuring she looks well neurologic exam is nonfocal I do not appreciate any swelling in her lower extremities and she is neurologically and vascularly intact.  CT head was obtained from triage and this is normal chest x-ray is clear Doppler ultrasound the right lower extremity is negative for DVT.  Patient's labs including renal function and blood counts are reassuring.  Ultimately I suspect that this hypertension is asymptomatic will need to be controlled in the long-term.  We will start her back on her amlodipine.  I have low suspicion for acute intracranial process such as cervical artery dissection or cerebral venous sinus thrombosis as a cause of her headache.  Low suspicion for subarachnoid given the nature of the headache and how well she appears.  Do not feel that she needs further imaging at this time.  We will treat supportively with Tylenol and Motrin.  She is otherwise appropriate for discharge.      FINAL CLINICAL IMPRESSION(S) / ED DIAGNOSES   Final diagnoses:  Hypertension, unspecified type  Nonintractable headache, unspecified chronicity pattern, unspecified headache type     Rx / DC Orders   ED Discharge Orders     None        Note:  This document was prepared using Dragon voice recognition software and may include unintentional dictation errors.   Georga Hacking, MD 05/31/22 1413    Georga Hacking, MD 05/31/22 [phone]

## 2022-10-09 ENCOUNTER — Other Ambulatory Visit: Payer: Self-pay

## 2022-10-09 ENCOUNTER — Emergency Department
Admission: EM | Admit: 2022-10-09 | Discharge: 2022-10-09 | Disposition: A | Discharge status: Home / Self Care | Payer: Medicaid Other | Attending: Emergency Medicine | Admitting: Emergency Medicine

## 2022-10-09 ENCOUNTER — Emergency Department: Payer: Medicaid Other

## 2022-10-09 DIAGNOSIS — M5416 Radiculopathy, lumbar region: Secondary | ICD-10-CM

## 2022-10-09 MED ORDER — ACETAMINOPHEN 325 MG PO TABS
650.0000 mg | ORAL_TABLET | Freq: Once | ORAL | Status: AC
  Administered 2022-10-09: 650 mg via ORAL
  Filled 2022-10-09: qty 2

## 2022-10-09 MED ORDER — NAPROXEN 500 MG PO TABS: 500.0000 mg | ORAL_TABLET | Freq: Two times a day (BID) | ORAL | 0 refills | Status: AC

## 2022-10-09 MED ORDER — KETOROLAC TROMETHAMINE 15 MG/ML IJ SOLN
15.0000 mg | Freq: Once | INTRAMUSCULAR | Status: AC
  Administered 2022-10-09: 15 mg via INTRAMUSCULAR
  Filled 2022-10-09: qty 1

## 2022-10-09 MED ORDER — LIDOCAINE 5 % EX PTCH
1.0000 | MEDICATED_PATCH | CUTANEOUS | Status: DC
  Administered 2022-10-09: 1 via TRANSDERMAL
  Filled 2022-10-09: qty 1

## 2022-10-09 MED ORDER — LIDOCAINE 5 % EX PTCH: 1.0000 | MEDICATED_PATCH | Freq: Two times a day (BID) | CUTANEOUS | 0 refills | Status: AC

## 2022-10-09 NOTE — ED Triage Notes (Signed)
Pt comes with c/o slip and fall this am. Pt states right hip and leg pain.

## 2022-10-09 NOTE — ED Provider Notes (Signed)
[facility] Provider Note    Event Date/Time   First MD Initiated Contact with Patient 10/09/22 1238     (approximate)   History   Fall   HPI  Rachel Cross is a 62 y.o. female with no reported past medical history presents today for evaluation of right sided back pain that radiates into her right hip and her right knee.  Patient reports that her foot got stuck while walking on the stairs and pulled behind her.  She did not fall to the ground.  She has been able to ambulate.  She does not take anticoagulation.  She denies weakness in her legs.  She denies urinary or fecal incontinence or retention.  No fevers.  No abdominal pain.  She has not noticed any skin changes.  There are no problems to display for this patient.         Physical Exam   Triage Vital Signs: ED Triage Vitals  Enc Vitals Group     BP 10/09/22 1229 127/82     Pulse Rate 10/09/22 1229 76     Resp 10/09/22 1229 18     Temp 10/09/22 1229 98.1 F (36.7 C)     Temp src --      SpO2 10/09/22 1229 100 %     Weight --      Height --      Head Circumference --      Peak Flow --      Pain Score 10/09/22 1228 8     Pain Loc --      Pain Edu? --      Excl. in [place]? --     Most recent vital signs: Vitals:   10/09/22 1229 10/09/22 1613  BP: 127/82   Pulse: 76 78  Resp: 18   Temp: 98.1 F (36.7 C)   SpO2: 100% 100%    Physical Exam Vitals and nursing note reviewed.  Constitutional:      General: Awake and alert. No acute distress.    Appearance: Normal appearance. The patient is normal weight.  HENT:     Head: Normocephalic and atraumatic.     Mouth: Mucous membranes are moist.  Eyes:     General: PERRL. Normal EOMs        Right eye: No discharge.        Left eye: No discharge.     Conjunctiva/sclera: Conjunctivae normal.  Cardiovascular:     Rate and Rhythm: Normal rate and regular rhythm.     Pulses: Normal pulses.     Heart sounds: Normal heart  sounds Pulmonary:     Effort: Pulmonary effort is normal. No respiratory distress.     Breath sounds: Normal breath sounds.  Abdominal:     Abdomen is soft. There is no abdominal tenderness. No rebound or guarding. No distention. Pelvis stable.  Pain with active range of motion of the hip and knee.  Mild tenderness palpation to the anterior knee, though full and intact range of motion.  No deformity or ecchymosis. Back: No midline tenderness.  Right-sided lumbar paraspinal muscle tenderness.  Strength and sensation 5/5 to bilateral lower extremities. Normal great toe extension against resistance. Normal sensation throughout feet. Normal patellar reflexes.  Positive SLR on the right and opposite SLR bilaterally. Negative FABER test  Musculoskeletal:        General: No swelling. Normal range of motion.     Cervical back: Normal range of motion and neck supple.  Skin:    General: Skin is warm and dry.     Capillary Refill: Capillary refill takes less than 2 seconds.     Findings: No rash.  Neurological:     Mental Status: The patient is awake and alert.      ED Results / Procedures / Treatments   Labs (all labs ordered are listed, but only abnormal results are displayed) Labs Reviewed - No data to display   EKG     RADIOLOGY I independently reviewed and interpreted imaging and agree with radiologists findings.     PROCEDURES:  Critical Care performed:   Procedures   MEDICATIONS ORDERED IN ED: Medications  lidocaine (LIDODERM) 5 % 1 patch (1 patch Transdermal Patch Applied 10/09/22 1346)  ketorolac (TORADOL) 15 MG/ML injection 15 mg (15 mg Intramuscular Given 10/09/22 1345)  acetaminophen (TYLENOL) tablet 650 mg (650 mg Oral Given 10/09/22 1346)     IMPRESSION / MDM / ASSESSMENT AND PLAN / ED COURSE  I reviewed the triage vital signs and the nursing notes.   Differential diagnosis includes, but is not limited to, lumbar radiculopathy, contusion, sprain, ligamental  injury, fracture, dislocation.  Patient is awake and alert, hemodynamically stable and afebrile.  She has intact active and passive range of motion of her hip and knee.  There is no overlying skin changes to suggest hematoma or vascular injury.  There is no swelling or pitting edema noted throughout her extremity.  She is able to ambulate with a steady gait.  She does have pain with straight leg raise, and has tenderness palpation over her right lumbar paraspinal muscles, consistent with either musculoskeletal injury or lumbar radiculopathy.  X-rays obtained demonstrate degenerative changes without acute bony injury. She has 5 out of 5 strength with intact sensation to extensor hallucis dorsiflexion and plantarflexion of bilateral lower extremities with normal patellar reflexes bilaterally. Most likely etiology at this point is muscle strain vs herniated disc. No red flags to indicate patient is at risk for more auspicious process that would require urgent/emergent spinal imaging or subspecialty evaluation at this time. No major trauma, no midline tenderness, no history or physical exam findings to suggest cauda equina syndrome or spinal cord compression. No focal neurological deficits on exam. No constitutional symptoms or history of immunosuppression or IVDA to suggest potential for epidural abscess. Not anticoagulated, no history of bleeding diastasis to suggest risk for epidural hematoma. No chronic steroid use or advanced age or history of malignancy to suggest proclivity towards pathological fracture.  No abdominal pain or flank pain to suggest kidney stone, no history of kidney stone.  No fever or dysuria or CVAT to suggest pyelonephritis .  No chest pain, back pain, shortness of breath, neurological deficits, to suggest vascular catastrophe, and pulses are equal in all 4 extremities.   She was treated symptomatically with Toradol, Tylenol, and Lidoderm patch with significant improvement of her symptoms.   Upon reassessment, she feels improved and ready for discharge.  She requested a prescription for the medications which was provided for her.  We discussed return precautions and the importance of close outpatient follow-up.  Patient understands and agrees with plan.  She was discharged in stable condition.   Patient's presentation is most consistent with acute complicated illness / injury requiring diagnostic workup.   Clinical Course as of 10/09/22 1625  Wed Oct 09, 2022  1528 Patient reports that she feels improved and ready for discharge home [JP]    Clinical Course User Index [JP] Roland Rack, Eliezer Lofts  E, PA-C     FINAL CLINICAL IMPRESSION(S) / ED DIAGNOSES   Final diagnoses:  Lumbar radiculopathy     Rx / DC Orders   ED Discharge Orders          Ordered    naproxen (NAPROSYN) 500 MG tablet  2 times daily with meals        10/09/22 1529    lidocaine (LIDODERM) 5 %  Every 12 hours        10/09/22 1529             Note:  This document was prepared using Dragon voice recognition software and may include unintentional dictation errors.   Emeline Gins 10/09/22 1625    Vanessa Bristol, MD 10/10/22 [phone]

## 2022-10-09 NOTE — Discharge Instructions (Signed)
You may take the medications as prescribed to help with your symptoms.  Please return for any new, worsening, or change in symptoms or other concerns.  It was a pleasure caring for you today.

## 2023-01-22 ENCOUNTER — Other Ambulatory Visit: Payer: Self-pay

## 2023-01-22 ENCOUNTER — Emergency Department
Admission: EM | Admit: 2023-01-22 | Discharge: 2023-01-22 | Disposition: A | Discharge status: Home / Self Care | Payer: Medicaid Other | Attending: Emergency Medicine | Admitting: Emergency Medicine

## 2023-01-22 ENCOUNTER — Emergency Department: Payer: Medicaid Other

## 2023-01-22 DIAGNOSIS — M542 Cervicalgia: Secondary | ICD-10-CM | POA: Diagnosis present

## 2023-01-22 DIAGNOSIS — I10 Essential (primary) hypertension: Secondary | ICD-10-CM | POA: Diagnosis not present

## 2023-01-22 DIAGNOSIS — M436 Torticollis: Secondary | ICD-10-CM | POA: Diagnosis not present

## 2023-01-22 MED ORDER — BACLOFEN 10 MG PO TABS: 10.0000 mg | ORAL_TABLET | Freq: Three times a day (TID) | ORAL | 0 refills | Status: AC

## 2023-01-22 MED ORDER — MELOXICAM 15 MG PO TABS: 15.0000 mg | ORAL_TABLET | Freq: Every day | ORAL | 2 refills | Status: DC

## 2023-01-22 MED ORDER — HYDROCODONE-ACETAMINOPHEN 5-325 MG PO TABS
1.0000 | ORAL_TABLET | Freq: Once | ORAL | Status: AC
  Administered 2023-01-22: 1 via ORAL
  Filled 2023-01-22: qty 1

## 2023-01-22 NOTE — ED Triage Notes (Signed)
Pt presents to the ED due to neck and back pain. Pt states she is unsure if slept wrong or stiff. Pt denies trauma. Pt A&Ox4

## 2023-01-22 NOTE — Discharge Instructions (Signed)
Follow-up with your regular doctor or orthopedics if not improving in 3 to 4 days.  Return emergency department if worsening.  Use medication as prescribed.  Wear the soft collar for comfort.  Apply ice to the posterior neck.  If you use heat use wet heat followed by ice.

## 2023-01-22 NOTE — ED Notes (Signed)
PA-C placed a soft cervical collar on patient for comfort. Patient states it has helped with the pain.

## 2023-01-22 NOTE — ED Provider Triage Note (Signed)
Emergency Medicine Provider Triage Evaluation Note  Rachel Cross , a 62 y.o. female  was evaluated in triage.  Pt complains of L side neck pain. Patient with pain x 2 days. No trauma. No neuro deficits. Pain in L side only.  Review of Systems  Positive: Neck pain Negative: Fever, ha, CP  Physical Exam  BP (!) 172/94 (BP Location: Right Arm)   Pulse 90   Temp 98.3 F (36.8 C) (Oral)   Resp 18   Wt 95.3 kg   LMP 11/10/2017 (Approximate)   SpO2 98%   BMI 36.05 kg/m  Gen:   Awake, no distress   Resp:  Normal effort  MSK:   Moves extremities without difficulty. TTP over the L paraspinal and trap muscle Other:    Medical Decision Making  Medically screening exam initiated at 4:52 PM.  Appropriate orders placed.  GARRETT BOWRING was informed that the remainder of the evaluation will be completed by another provider, this initial triage assessment does not replace that evaluation, and the importance of remaining in the ED until their evaluation is complete.  Julieta Gutting, PA-C 01/22/23 1655

## 2023-01-22 NOTE — ED Provider Notes (Signed)
[facility] Provider Note    Event Date/Time   First MD Initiated Contact with Patient 01/22/23 1758     (approximate)   History   Back Pain and Neck Pain   HPI  Rachel Cross is a 62 y.o. female with history of hypertension presents emergency department complaining of neck pain and stiffness that started 2 days ago.  No known injury.  Pain is mostly on the left side and is reproduced with turning her neck.  States she feels like she needs something to hold her neck up to alleviate pain.  No numbness or tingling in the arms.  No chest pain or shortness of breath      Physical Exam   Triage Vital Signs: ED Triage Vitals  Enc Vitals Group     BP 01/22/23 1649 (!) 172/94     Pulse Rate 01/22/23 1649 90     Resp 01/22/23 1649 18     Temp 01/22/23 1649 98.3 F (36.8 C)     Temp Source 01/22/23 1649 Oral     SpO2 01/22/23 1649 98 %     Weight 01/22/23 1650 210 lb (95.3 kg)     Height --      Head Circumference --      Peak Flow --      Pain Score 01/22/23 1650 10     Pain Loc --      Pain Edu? --      Excl. in [place]? --     Most recent vital signs: Vitals:   01/22/23 1649  BP: (!) 172/94  Pulse: 90  Resp: 18  Temp: 98.3 F (36.8 C)  SpO2: 98%     General: Awake, no distress.   CV:  Good peripheral perfusion. regular rate and  rhythm Resp:  Normal effort.  Abd:  No distention.   Other:  C-spine nontender, large amount of spasms noted in the musculature along the left shoulder and SCM and supraspinatus, grips are equal bilaterally, decreased range of motion of the C-spine secondary to discomfort   ED Results / Procedures / Treatments   Labs (all labs ordered are listed, but only abnormal results are displayed) Labs Reviewed - No data to display   EKG     RADIOLOGY     PROCEDURES:   Procedures   MEDICATIONS ORDERED IN ED: Medications  HYDROcodone-acetaminophen (NORCO/VICODIN) 5-325 MG per tablet 1 tablet (has no  administration in time range)     IMPRESSION / MDM / ASSESSMENT AND PLAN / ED COURSE  I reviewed the triage vital signs and the nursing notes.                              Differential diagnosis includes, but is not limited to, torticollis, bulging disc, fracture  Patient's presentation is most consistent with acute, uncomplicated illness.   The patient has not had an injury so do not feel she has fracture.  She does not have numbness or tingling to indicate disc protrusion.  Her exam is most consistent with torticollis.  Will place her in a soft c-collar which did alleviate the pain.  She will be given a prescription for meloxicam and baclofen.  Follow-up with orthopedics if not improving in 3 to 4 days.  Use alternating heat and ice.  Patient is in agreement treatment plan.  She was discharged stable condition.      FINAL CLINICAL IMPRESSION(S) /  ED DIAGNOSES   Final diagnoses:  Torticollis, acute     Rx / DC Orders   ED Discharge Orders          Ordered    meloxicam (MOBIC) 15 MG tablet  Daily        01/22/23 1810    baclofen (LIORESAL) 10 MG tablet  3 times daily        01/22/23 1810             Note:  This document was prepared using Dragon voice recognition software and may include unintentional dictation errors.    Faythe Ghee, PA-C 01/22/23 1815    Trinna Post, MD 01/22/23 [phone]

## 2023-06-13 ENCOUNTER — Other Ambulatory Visit: Payer: Self-pay

## 2023-06-13 ENCOUNTER — Emergency Department: Payer: Medicaid Other

## 2023-06-13 ENCOUNTER — Emergency Department
Admission: EM | Admit: 2023-06-13 | Discharge: 2023-06-13 | Disposition: A | Discharge status: Home / Self Care | Payer: Medicaid Other | Attending: Emergency Medicine | Admitting: Emergency Medicine

## 2023-06-13 DIAGNOSIS — M25561 Pain in right knee: Secondary | ICD-10-CM | POA: Insufficient documentation

## 2023-06-13 DIAGNOSIS — J45909 Unspecified asthma, uncomplicated: Secondary | ICD-10-CM | POA: Insufficient documentation

## 2023-06-13 DIAGNOSIS — Z760 Encounter for issue of repeat prescription: Secondary | ICD-10-CM | POA: Insufficient documentation

## 2023-06-13 DIAGNOSIS — G8929 Other chronic pain: Secondary | ICD-10-CM | POA: Diagnosis not present

## 2023-06-13 DIAGNOSIS — M7989 Other specified soft tissue disorders: Secondary | ICD-10-CM | POA: Diagnosis not present

## 2023-06-13 DIAGNOSIS — M79604 Pain in right leg: Secondary | ICD-10-CM | POA: Diagnosis present

## 2023-06-13 DIAGNOSIS — I10 Essential (primary) hypertension: Secondary | ICD-10-CM | POA: Diagnosis not present

## 2023-06-13 MED ORDER — IBUPROFEN 800 MG PO TABS
800.0000 mg | ORAL_TABLET | Freq: Once | ORAL | Status: AC
  Administered 2023-06-13: 800 mg via ORAL
  Filled 2023-06-13: qty 1

## 2023-06-13 MED ORDER — MELOXICAM 15 MG PO TABS: 15.0000 mg | ORAL_TABLET | Freq: Every day | ORAL | 2 refills | Status: DC

## 2023-06-13 MED ORDER — AMLODIPINE BESYLATE 10 MG PO TABS: 10.0000 mg | ORAL_TABLET | Freq: Every day | ORAL | 2 refills | Status: DC

## 2023-06-13 NOTE — Discharge Instructions (Addendum)
The ultrasound showed that you do not have a blood clot in your right leg.  I believe the pain and swelling is coming from your arthritis.  Please take the meloxicam once a day for 2 weeks.  You can take 650 mg of Tylenol every 6 hours as needed for pain.  You can ice, elevate and apply heat as you feel is needed.  You can also try topical pain relievers.  I recommend that you get a compressive knee sleeve which will provide additional support and will decrease your swelling.  I would like you to follow-up with the orthopedic provider, Dr. Audelia Acton, call his office to schedule an appointment.    I sent a refill of your amlodipine.  I have also placed a referral for a primary care provider.  Watch for a phone call from the hospital regarding this.  I have also provided a list of local primary care providers that you could reach out to.  Please go to the following website to schedule new (and existing) patient appointments:   [email or web address]   The following is a list of primary care offices in the area who are accepting new patients at this time.  Please reach out to one of them directly and let them know you would like to schedule an appointment to follow up on an Emergency Department visit, and/or to establish a new primary care provider (PCP).  There are likely other primary care clinics in the are who are accepting new patients, but this is an excellent place to start:  [facility] Lead physician: Dr Shirlee Latch [address] [place], [place] [address] [phone]  [facility] Lead Physician: Dr Alba Cory [address], [place], [place] [address] [phone]  [facility]  Lead Physician: Dr Olevia Perches [address] [place], [place] [address] [phone]  [facility] Lead Physician: Dr Sofie Hartigan [address] [place], [place] [address] [phone]  [facility] Primary  Care & Sports Medicine at [facility] Lead Physician: Dr Bari Edward [address] [place], [place], [place] [address] [phone]

## 2023-06-13 NOTE — ED Notes (Signed)
US at bedside

## 2023-06-13 NOTE — ED Provider Notes (Signed)
[facility] Provider Note    Event Date/Time   First MD Initiated Contact with Patient 06/13/23 1240     (approximate)   History   Medication Refill   HPI  Rachel Cross is a 62 y.o. female with PMH of hypertension and asthma who presents for a refill of her amlodipine as well as evaluation for pain and swelling of her right leg.  Patient denies any specific injuries or falls.  She states that the pain in her right leg has been ongoing for a while but has gotten worse in the past 3 days.  She is having difficulty bending and extending her knee, which makes it difficult to walk.  She reports she had knee surgery a few years ago.      Physical Exam   Triage Vital Signs: ED Triage Vitals  Encounter Vitals Group     BP 06/13/23 1142 (!) 144/97     Systolic BP Percentile --      Diastolic BP Percentile --      Pulse Rate 06/13/23 1142 71     Resp 06/13/23 1142 18     Temp 06/13/23 1142 98.2 F (36.8 C)     Temp src --      SpO2 06/13/23 1142 94 %     Weight 06/13/23 1142 210 lb (95.3 kg)     Height 06/13/23 1142 5\' 4"  (1.626 m)     Head Circumference --      Peak Flow --      Pain Score 06/13/23 1141 7     Pain Loc --      Pain Education --      Exclude from Growth Chart --     Most recent vital signs: Vitals:   06/13/23 1142  BP: (!) 144/97  Pulse: 71  Resp: 18  Temp: 98.2 F (36.8 C)  SpO2: 94%    General: Awake, no distress.  CV:  Good peripheral perfusion.  Resp:  Normal effort.  Abd:  No distention.  Other:  Right leg does not appear significantly swollen when compared to the left, no overlying skin changes or bruising.  Pain elicited with compression of the calf muscle, tenderness to palpation surrounding the patella.  Knee flexion ROM limited due to pain.  ROM of hip and ankle maintained.  Dorsalis pedis pulse 2+ regular, sensation intact across all dermatomes.   ED Results / Procedures / Treatments   Labs (all labs  ordered are listed, but only abnormal results are displayed) Labs Reviewed - No data to display   RADIOLOGY  DVT ultrasound of the right leg obtained, interpreted the images as well as reviewed the radiologist report which did not show evidence of a DVT or thrombophlebitis.  PROCEDURES:  Critical Care performed: No  Procedures   MEDICATIONS ORDERED IN ED: Medications  ibuprofen (ADVIL) tablet 800 mg (800 mg Oral Given 06/13/23 1341)     IMPRESSION / MDM / ASSESSMENT AND PLAN / ED COURSE  I reviewed the triage vital signs and the nursing notes.                             62 year old female presents for refill on her amlodipine and also states she is having right leg pain and swelling.  Patient was hypertensive in triage but has not had her medication, vital signs stable otherwise.  Patient NAD on exam.  Differential diagnosis includes, but is  not limited to, medication refill, DVT, arthritis, muscle strain, dependent edema.  Patient's presentation is most consistent with acute complicated illness / injury requiring diagnostic workup.  I refilled patient's amlodipine.  Patient states she just recently got Medicaid coverage so I placed a referral for primary care and provided her with a list of local providers.  In regards to patient's knee pain, I suspect that this is the result of her previously documented severe tricompartmental arthritis, however given her reports of swelling I did think it pertinent to rule out DVT.  Ultrasound was obtained, interpreted the images as well as reviewed the radiologist report which was negative for DVT.  Given the negative imaging I believe her pain is due to her arthritis.  Since she already has previous x-rays that document her arthritis and has not had any new falls or injuries I did not feel it was necessary to repeat the imaging today.  Most of her pain is surrounding the knee, where she is tender to palpation and has some difficulty with range  of motion.  Given the chronic nature of her pain I think it might be time for her to consider corticosteroid injections.  I explained this is not something that we would do in the ED and that she would need to follow-up with an orthopedic provider.  She was given contact information for 1.  I also sent her a prescription for meloxicam and she can take Tylenol as needed.  I further recommended that she wear a compressive knee sleeve this will provide some additional support and may help with her swelling.  She was agreeable to plan, voiced understanding and was stable at discharge.      FINAL CLINICAL IMPRESSION(S) / ED DIAGNOSES   Final diagnoses:  Medication refill  Chronic pain of right knee     Rx / DC Orders   ED Discharge Orders          Ordered    amLODipine (NORVASC) 10 MG tablet  Daily        06/13/23 1529    meloxicam (MOBIC) 15 MG tablet  Daily        06/13/23 1530    Ambulatory Referral to Primary Care (Establish Care)        06/13/23 1532             Note:  This document was prepared using Dragon voice recognition software and may include unintentional dictation errors.   Cameron Ali, PA-C 06/13/23 1555    Chesley Noon, MD 06/14/23 [phone]

## 2023-06-13 NOTE — ED Triage Notes (Signed)
Pt present to ED for refill of amlodipine prescription ran out 4 days.  Pt also complains of pain and swelling in left ankle. Denies any injury.

## 2023-12-29 ENCOUNTER — Other Ambulatory Visit: Payer: Self-pay

## 2023-12-29 ENCOUNTER — Encounter: Payer: Self-pay | Admitting: Emergency Medicine

## 2023-12-29 ENCOUNTER — Emergency Department
Admission: EM | Admit: 2023-12-29 | Discharge: 2023-12-29 | Disposition: A | Discharge status: Home / Self Care | Attending: Emergency Medicine | Admitting: Emergency Medicine

## 2023-12-29 DIAGNOSIS — J45909 Unspecified asthma, uncomplicated: Secondary | ICD-10-CM | POA: Diagnosis not present

## 2023-12-29 DIAGNOSIS — I10 Essential (primary) hypertension: Secondary | ICD-10-CM | POA: Diagnosis not present

## 2023-12-29 DIAGNOSIS — Z760 Encounter for issue of repeat prescription: Secondary | ICD-10-CM | POA: Diagnosis present

## 2023-12-29 MED ORDER — AMLODIPINE BESYLATE 10 MG PO TABS: 10.0000 mg | ORAL_TABLET | Freq: Every day | ORAL | 2 refills | Status: DC

## 2023-12-29 NOTE — ED Triage Notes (Signed)
 Pt to ED via POV for medication refill. Pt needs to get a refill on her amlodipine . Pt states that she does not have a PCP. Pt states that she has been out of her medication for the last 3 days. Pt also reports feeling dizzy and lightheaded and feeling like her "bones are aching".

## 2023-12-29 NOTE — Discharge Instructions (Addendum)
 Can use taking her amlodipine  daily to control your blood pressure.  Also a list of medical clinics are listed on your discharge papers also a referral was placed with the hospital for a primary care provider.  Please go to the following website to schedule new (and existing) patient appointments:   [email or web address]   The following is a list of primary care offices in the area who are accepting new patients at this time.  Please reach out to one of them directly and let them know you would like to schedule an appointment to follow up on an Emergency Department visit, and/or to establish a new primary care provider (PCP).  There are likely other primary care clinics in the are who are accepting new patients, but this is an excellent place to start:  [facility] Lead physician: Dr Aden Agreste [address] [place], [place] [address] [phone]  [facility] Lead Physician: Dr Arleen Lacer [address], [place], [place] [address] [phone]  [facility]  Lead Physician: Dr Terre Ferri [address] [place], [place] [address] [phone]  [facility] Lead Physician: Dr Audrie Blind [address] [place], [place] [address] [phone]  [facility] Primary Care & Sports Medicine at [facility] Lead Physician: Dr Janna Melter [address] [place], [place], [place] [address] [phone]

## 2023-12-29 NOTE — ED Provider Notes (Signed)
 [facility] Provider Note    Event Date/Time   First MD Initiated Contact with Patient 12/29/23 1202     (approximate)   History   Medication Refill   HPI  Rachel Cross is a 63 y.o. female   presents to the ED for medication refill.  Patient states that she needs her amlodipine  for her hypertension, she has been out of medication for 3 days.  She states that she has been coming to the emergency department since prior to COVID and has never had a primary care provider.  She states she has some arthritis but otherwise feels fine.  Patient has history of hypertension and asthma.      Physical Exam   Triage Vital Signs: ED Triage Vitals  Encounter Vitals Group     BP 12/29/23 1213 139/79     Systolic BP Percentile --      Diastolic BP Percentile --      Pulse Rate 12/29/23 1213 75     Resp 12/29/23 1213 19     Temp 12/29/23 1213 97.7 F (36.5 C)     Temp Source 12/29/23 1213 Oral     SpO2 12/29/23 1213 100 %     Weight 12/29/23 1204 209 lb 7 oz (95 kg)     Height 12/29/23 1204 5\' 4"  (1.626 m)     Head Circumference --      Peak Flow --      Pain Score 12/29/23 1204 7     Pain Loc --      Pain Education --      Exclude from Growth Chart --     Most recent vital signs: Vitals:   12/29/23 1213  BP: 139/79  Pulse: 75  Resp: 19  Temp: 97.7 F (36.5 C)  SpO2: 100%     General: Awake, no distress.  Alert, talkative, CV:  Good peripheral perfusion.  Heart regular rate and rhythm. Resp:  Normal effort.  Lungs are clear bilaterally. Abd:  No distention.  Other:     ED Results / Procedures / Treatments   Labs (all labs ordered are listed, but only abnormal results are displayed) Labs Reviewed - No data to display   PROCEDURES:  Critical Care performed:   Procedures   MEDICATIONS ORDERED IN ED: Medications - No data to display   IMPRESSION / MDM / ASSESSMENT AND PLAN / ED COURSE  I reviewed the triage vital signs and  the nursing notes.   Differential diagnosis includes, but is not limited to, hypertension, medication management, need for primary care provider.  63 year old female presents to the ED for refill of her amlodipine  for her blood pressure.  Patient states she has been out of medication for the last 3 days.  She states that all the years that she has been hypertensive she has never had a PCP.  A referral was placed and also a list of clinics was printed on her discharge papers so that she can obtain a PCP.  Prescription for 90-day supply was sent to the pharmacy for her to continue taking amlodipine  on a daily basis.      Patient's presentation is most consistent with acute, uncomplicated illness.  FINAL CLINICAL IMPRESSION(S) / ED DIAGNOSES   Final diagnoses:  Encounter for medication refill     Rx / DC Orders   ED Discharge Orders          Ordered    amLODipine  (NORVASC ) 10 MG tablet  Daily        12/29/23 1218    Ambulatory Referral to Primary Care (Establish Care)       Comments: Hypertension management   12/29/23 1220             Note:  This document was prepared using Dragon voice recognition software and may include unintentional dictation errors.   Stafford Eagles, PA-C 12/29/23 1226    Bryson Carbine, MD 12/29/23 [phone]

## 2024-01-13 ENCOUNTER — Emergency Department

## 2024-01-13 ENCOUNTER — Emergency Department
Admission: EM | Admit: 2024-01-13 | Discharge: 2024-01-13 | Disposition: A | Discharge status: Home / Self Care | Attending: Emergency Medicine | Admitting: Emergency Medicine

## 2024-01-13 DIAGNOSIS — M1711 Unilateral primary osteoarthritis, right knee: Secondary | ICD-10-CM | POA: Diagnosis not present

## 2024-01-13 DIAGNOSIS — M25561 Pain in right knee: Secondary | ICD-10-CM | POA: Diagnosis present

## 2024-01-13 MED ORDER — ACETAMINOPHEN 325 MG PO TABS
650.0000 mg | ORAL_TABLET | Freq: Once | ORAL | Status: AC
  Administered 2024-01-13: 650 mg via ORAL
  Filled 2024-01-13: qty 2

## 2024-01-13 NOTE — ED Triage Notes (Signed)
 Pt pov to ED for right knee pain that started today. Pt reports hx of injury to that knee, but denies recent injury or trauma. Pt reports some splotching to bilateral lower extremities. Pt thinks she may be allergic to meloxicam  that she was started on two weeks ago. Pt A&Ox4 at time of triage.

## 2024-01-13 NOTE — ED Provider Notes (Signed)
 [facility] Provider Note    Event Date/Time   First MD Initiated Contact with Patient 01/13/24 1557     (approximate)   History   Leg Pain   HPI  Rachel Cross is a 63 y.o. female who presents today for evaluation of right lower extremity swelling and pain.  Patient reports that this began today.  She reports that most of her pain is behind her right knee.  She also thinks that her leg is swollen because her pants up left an indent on her affected side but not on her unaffected side.  No chest pain or shortness of breath.  No history of PE or DVT.  There are no active problems to display for this patient.         Physical Exam   Triage Vital Signs: ED Triage Vitals [01/13/24 1445]  Encounter Vitals Group     BP (!) 154/99     Systolic BP Percentile      Diastolic BP Percentile      Pulse Rate 90     Resp 18     Temp 98.5 F (36.9 C)     Temp Source Oral     SpO2 98 %     Weight 220 lb (99.8 kg)     Height 5\' 4"  (1.626 m)     Head Circumference      Peak Flow      Pain Score 10     Pain Loc      Pain Education      Exclude from Growth Chart     Most recent vital signs: Vitals:   01/13/24 1445  BP: (!) 154/99  Pulse: 90  Resp: 18  Temp: 98.5 F (36.9 C)  SpO2: 98%    Physical Exam Vitals and nursing note reviewed.  Constitutional:      General: Awake and alert. No acute distress.    Appearance: Normal appearance.   HENT:     Head: Normocephalic and atraumatic.     Mouth: Mucous membranes are moist.  Eyes:     General: PERRL. Normal EOMs        Right eye: No discharge.        Left eye: No discharge.     Conjunctiva/sclera: Conjunctivae normal.  Cardiovascular:     Rate and Rhythm: Normal rate and regular rhythm.     Pulses: Normal pulses.  Pulmonary:     Effort: Pulmonary effort is normal. No respiratory distress.     Breath sounds: Normal breath sounds.  Abdominal:     Abdomen is soft. There is no abdominal  tenderness. No rebound or guarding. No distention. Musculoskeletal:        General: No swelling. Normal range of motion.     Cervical back: Normal range of motion and neck supple.  No deformity or rash. No joint line tenderness. No patellar tenderness, no ballotment Warm and well perfused extremity with 2+ pedal pulses 5/5 strength to dorsiflexion and plantarflexion at the ankle with intact sensation throughout extremity Limited range of motion of the knee secondary to pain 80 to 120 degrees, with intact flexion and extension to active and passive range of motion. Extensor mechanism intact. No ligamentous laxity. Negative anterior/posterior drawer/negative lachman, negative mcmurrays No effusion or warmth or erythema Intact quadriceps, hamstring function, patellar tendon function Pelvis stable Full ROM of ankle without pain or swelling Foot warm and well perfused Skin:    General: Skin is warm and  dry.     Capillary Refill: Capillary refill takes less than 2 seconds.     Findings: No rash.  Neurological:     Mental Status: The patient is awake and alert.      ED Results / Procedures / Treatments   Labs (all labs ordered are listed, but only abnormal results are displayed) Labs Reviewed - No data to display   EKG     RADIOLOGY I independently reviewed and interpreted imaging and agree with radiologists findings.     PROCEDURES:  Critical Care performed:   Procedures   MEDICATIONS ORDERED IN ED: Medications  acetaminophen  (TYLENOL ) tablet 650 mg (650 mg Oral Given 01/13/24 1628)     IMPRESSION / MDM / ASSESSMENT AND PLAN / ED COURSE  I reviewed the triage vital signs and the nursing notes.   Differential diagnosis includes, but is not limited to, osteoarthritis, DVT, Baker's cyst, dependent edema.  Patient is awake and alert, hemodynamically stable and afebrile.  No skin changes noted to her lower extremities.  She does have swelling to her right lower  extremity and pain to the posterior portion of her leg, will obtain ultrasound.  Patient is able to flex extend her knee but has pain with doing so.  X-ray was obtained in triage, which reveals severe tricompartmental osteoarthritis.  Ultrasound is negative for DVT.  Discussed findings with patient and recommended outpatient follow-up with orthopedics for further management of her tricompartmental osteoarthritis.  The appropriate follow-up information was provided.  We discussed return precautions and outpatient follow-up.  Patient understands and agrees with plan.  She was discharged in stable condition.   Patient's presentation is most consistent with acute complicated illness / injury requiring diagnostic workup.      FINAL CLINICAL IMPRESSION(S) / ED DIAGNOSES   Final diagnoses:  Arthritis of right knee     Rx / DC Orders   ED Discharge Orders     None        Note:  This document was prepared using Dragon voice recognition software and may include unintentional dictation errors.   Haya Hemler E, PA-C 01/13/24 1810    Jacquie Maudlin, MD 01/13/24 [phone]

## 2024-01-13 NOTE — Discharge Instructions (Signed)
 Your ultrasound does not reveal any blood clots.  Your x-ray shows severe osteoarthritis.  You may follow-up with orthopedics for further management.  Please return for any new, worsening, or change in symptoms or other concerns.  Is a pleasure caring for you today.

## 2024-03-05 ENCOUNTER — Ambulatory Visit: Admitting: Nurse Practitioner

## 2024-03-05 ENCOUNTER — Encounter: Payer: Self-pay | Admitting: Nurse Practitioner

## 2024-03-05 VITALS — BP 140/82 | HR 98 | Temp 98.2°F | Resp 18 | Ht 63.72 in | Wt 223.3 lb

## 2024-03-05 DIAGNOSIS — R32 Unspecified urinary incontinence: Secondary | ICD-10-CM

## 2024-03-05 DIAGNOSIS — Z1159 Encounter for screening for other viral diseases: Secondary | ICD-10-CM

## 2024-03-05 DIAGNOSIS — M25569 Pain in unspecified knee: Secondary | ICD-10-CM | POA: Diagnosis not present

## 2024-03-05 DIAGNOSIS — Z114 Encounter for screening for human immunodeficiency virus [HIV]: Secondary | ICD-10-CM

## 2024-03-05 DIAGNOSIS — I10 Essential (primary) hypertension: Secondary | ICD-10-CM | POA: Diagnosis not present

## 2024-03-05 DIAGNOSIS — N951 Menopausal and female climacteric states: Secondary | ICD-10-CM

## 2024-03-05 DIAGNOSIS — Z130 Encounter for screening for diseases of the blood and blood-forming organs and certain disorders involving the immune mechanism: Secondary | ICD-10-CM

## 2024-03-05 DIAGNOSIS — M722 Plantar fascial fibromatosis: Secondary | ICD-10-CM

## 2024-03-05 DIAGNOSIS — Z1231 Encounter for screening mammogram for malignant neoplasm of breast: Secondary | ICD-10-CM

## 2024-03-05 DIAGNOSIS — G8929 Other chronic pain: Secondary | ICD-10-CM

## 2024-03-05 DIAGNOSIS — Z139 Encounter for screening, unspecified: Secondary | ICD-10-CM

## 2024-03-05 DIAGNOSIS — Z1211 Encounter for screening for malignant neoplasm of colon: Secondary | ICD-10-CM

## 2024-03-05 DIAGNOSIS — Z131 Encounter for screening for diabetes mellitus: Secondary | ICD-10-CM

## 2024-03-05 DIAGNOSIS — Z13220 Encounter for screening for lipoid disorders: Secondary | ICD-10-CM

## 2024-03-05 LAB — POCT URINALYSIS DIPSTICK
Bilirubin, UA: NEGATIVE
Blood, UA: NEGATIVE
Glucose, UA: NEGATIVE
Ketones, UA: NEGATIVE
Leukocytes, UA: NEGATIVE
Nitrite, UA: NEGATIVE
Odor: NORMAL
Protein, UA: NEGATIVE
Spec Grav, UA: 1.015 (ref 1.010–1.025)
Urobilinogen, UA: 0.2 U/dL
pH, UA: 6 (ref 5.0–8.0)

## 2024-03-05 MED ORDER — AMLODIPINE BESYLATE 10 MG PO TABS: 10.0000 mg | ORAL_TABLET | Freq: Every day | ORAL | 2 refills | Status: AC

## 2024-03-05 MED ORDER — PROGESTERONE 200 MG PO CAPS: 200.0000 mg | ORAL_CAPSULE | Freq: Every day | ORAL | 1 refills | Status: AC

## 2024-03-05 MED ORDER — ESTRADIOL 0.025 MG/24HR TD PTTW: 1.0000 | MEDICATED_PATCH | TRANSDERMAL | 5 refills | Status: AC
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
## 2024-03-06 LAB — LIPID PANEL
Cholesterol: 223 mg/dL — ABNORMAL HIGH (ref <200)
HDL: 58 mg/dL (ref >=50)
LDL Cholesterol (Calc): 147 mg/dL — ABNORMAL HIGH
Non-HDL Cholesterol (Calc): 165 mg/dL — ABNORMAL HIGH (ref <130)
Total CHOL/HDL Ratio: 3.8 (calc) (ref <5.0)
Triglycerides: 77 mg/dL (ref <150)

## 2024-03-06 LAB — COMPREHENSIVE METABOLIC PANEL WITH GFR
AG Ratio: 1.7 (calc) (ref 1.0–2.5)
ALT: 12 U/L (ref 6–29)
AST: 17 U/L (ref 10–35)
Albumin: 4.6 g/dL (ref 3.6–5.1)
Alkaline phosphatase (APISO): 77 U/L (ref 37–153)
BUN: 8 mg/dL (ref 7–25)
CO2: 31 mmol/L (ref 20–32)
Calcium: 9.9 mg/dL (ref 8.6–10.4)
Chloride: 103 mmol/L (ref 98–110)
Creat: 0.61 mg/dL (ref 0.50–1.05)
Globulin: 2.7 g/dL (ref 1.9–3.7)
Glucose, Bld: 109 mg/dL — ABNORMAL HIGH (ref 65–99)
Potassium: 4.3 mmol/L (ref 3.5–5.3)
Sodium: 143 mmol/L (ref 135–146)
Total Bilirubin: 0.4 mg/dL (ref 0.2–1.2)
Total Protein: 7.3 g/dL (ref 6.1–8.1)
eGFR: 101 mL/min/1.73m2 (ref >=60)

## 2024-03-06 LAB — CBC WITH DIFFERENTIAL/PLATELET
Absolute Lymphocytes: 2690 {cells}/uL (ref 850–3900)
Absolute Monocytes: 390 {cells}/uL (ref 200–950)
Basophils Absolute: 43 {cells}/uL (ref 0–200)
Basophils Relative: 0.7 %
Eosinophils Absolute: 122 {cells}/uL (ref 15–500)
Eosinophils Relative: 2 %
HCT: 39.6 % (ref 35.0–45.0)
Hemoglobin: 12.8 g/dL (ref 11.7–15.5)
MCH: 28.3 pg (ref 27.0–33.0)
MCHC: 32.3 g/dL (ref 32.0–36.0)
MCV: 87.4 fL (ref 80.0–100.0)
MPV: 11.1 fL (ref 7.5–12.5)
Monocytes Relative: 6.4 %
Neutro Abs: 2855 {cells}/uL (ref 1500–7800)
Neutrophils Relative %: 46.8 %
Platelets: 265 Thousand/uL (ref 140–400)
RBC: 4.53 Million/uL (ref 3.80–5.10)
RDW: 13.5 % (ref 11.0–15.0)
Total Lymphocyte: 44.1 %
WBC: 6.1 Thousand/uL (ref 3.8–10.8)

## 2024-03-06 LAB — HEMOGLOBIN A1C
Hgb A1c MFr Bld: 5.8 % — ABNORMAL HIGH (ref <5.7)
Mean Plasma Glucose: 120 mg/dL
eAG (mmol/L): 6.6 mmol/L

## 2024-03-06 LAB — HEPATITIS C ANTIBODY: Hepatitis C Ab: NONREACTIVE

## 2024-03-06 LAB — HIV ANTIBODY (ROUTINE TESTING W REFLEX): HIV 1&2 Ab, 4th Generation: NONREACTIVE

## 2024-03-08 ENCOUNTER — Ambulatory Visit: Payer: Self-pay | Admitting: Nurse Practitioner

## 2024-03-10 ENCOUNTER — Telehealth: Payer: Self-pay

## 2024-03-10 MED ORDER — ROSUVASTATIN CALCIUM 5 MG PO TABS: 5.0000 mg | ORAL_TABLET | Freq: Every day | ORAL | 3 refills | Status: AC

## 2024-03-10 NOTE — Progress Notes (Signed)
 Complex Care Management Note Care Guide Note  03/10/2024 Name: Rachel Cross MRN: [id number] DOB: 02-26-1961   Complex Care Management Outreach Attempts: An unsuccessful telephone outreach was attempted today to offer the patient information about available complex care management services.  Follow Up Plan:  Additional outreach attempts will be made to offer the patient complex care management information and services.   Encounter Outcome:  No Answer  Dream[facility]  [facility] Care Management Assistant Direct Dial: [phone]  Fax: [phone]

## 2024-03-26 LAB — COLOGUARD: COLOGUARD: NEGATIVE

## 2024-04-02 ENCOUNTER — Ambulatory Visit: Admitting: Nurse Practitioner

## 2024-04-02 ENCOUNTER — Encounter: Payer: Self-pay | Admitting: Nurse Practitioner

## 2024-04-02 VITALS — BP 122/84 | HR 88 | Temp 98.0°F | Resp 18 | Ht 63.72 in | Wt 222.7 lb

## 2024-04-02 DIAGNOSIS — N951 Menopausal and female climacteric states: Secondary | ICD-10-CM

## 2024-04-02 DIAGNOSIS — I10 Essential (primary) hypertension: Secondary | ICD-10-CM

## 2024-04-02 NOTE — Progress Notes (Signed)
 BP 122/84   Pulse 88   Temp 98 F (36.7 C)   Resp 18   Ht 5' 3.72 (1.618 m)   Wt 222 lb 11.2 oz (101 kg)   LMP 11/10/2017 (Approximate)   SpO2 100%   BMI 38.56 kg/m    Subjective:    Patient ID: Rachel Cross, female    DOB: Aug 28, 1960, 63 y.o.   MRN: [id number]  HPI: Rachel Cross is a 63 y.o. female  Chief Complaint  Patient presents with   Medical Management of Chronic Issues   Hypertension    Discussed the use of AI scribe software for clinical note transcription with the patient, who gave verbal consent to proceed.  History of Present Illness Rachel Cross is a 63 year old female who presents for a four-week follow-up for blood pressure management.  Hypertension - Four weeks ago, blood pressure measured at 140/82 mmHg - Started on amlodipine  10 mg daily at that time - Currently feels well with no adverse effects reported  Menopausal symptoms - Managing symptoms with hormone replacement therapy (HRT) - Finds HRT helpful - Maintains contact with pharmacy to ensure adequate supply and refills of HRT patches  Knee pain - Experiencing ongoing knee pain - Received a cortisone injection, which initially caused discomfort but subsequently provided some relief - Uncertain about recommended frequency of cortisone injections - Difficulty with stairs, awaiting relocation to a lower floor in her apartment building for health reasons - Provided with a cane and a knee brace; knee brace does not fit well and slides off  Urinary incontinence - History of urinary incontinence - Referred to urology for further evaluation - Received notification for urology appointment but was initially unsure of the reason for referral         03/05/2024    1:58 PM  Depression screen PHQ 2/9  Decreased Interest 1  Down, Depressed, Hopeless 1  PHQ - 2 Score 2  Altered sleeping 0  Tired, decreased energy 1  Change in appetite 0  Feeling bad or failure about yourself   1  Trouble concentrating 0  Moving slowly or fidgety/restless 1  Suicidal thoughts 0  PHQ-9 Score 5  Difficult doing work/chores Not difficult at all    Relevant past medical, surgical, family and social history reviewed and updated as indicated. Interim medical history since our last visit reviewed. Allergies and medications reviewed and updated.  Review of Systems  Per HPI unless specifically indicated above     Objective:     BP 122/84   Pulse 88   Temp 98 F (36.7 C)   Resp 18   Ht 5' 3.72 (1.618 m)   Wt 222 lb 11.2 oz (101 kg)   LMP 11/10/2017 (Approximate)   SpO2 100%   BMI 38.56 kg/m    Wt Readings from Last 3 Encounters:  04/02/24 222 lb 11.2 oz (101 kg)  03/05/24 223 lb 4.8 oz (101.3 kg)  01/13/24 220 lb (99.8 kg)    Physical Exam Physical Exam VITALS: BP- 122/84 GENERAL: Alert, cooperative, well developed, no acute distress. HEENT: Normocephalic, normal oropharynx, moist mucous membranes. CHEST: Clear to auscultation bilaterally, no wheezes, rhonchi, or crackles. CARDIOVASCULAR: Normal heart rate and rhythm, S1 and S2 normal without murmurs. ABDOMEN: Soft, non-tender, non-distended, without organomegaly, normal bowel sounds. EXTREMITIES: No cyanosis or edema. NEUROLOGICAL: Cranial nerves grossly intact, moves all extremities without gross motor or sensory deficit.   Results for orders placed or performed in visit on 03/05/24  HIV antibody (with reflex)   Collection Time: 03/05/24  2:06 PM  Result Value Ref Range   HIV FINAL INTERPRETATION     HIV 1&2 Ab, 4th Generation NON-REACTIVE NON-REACTIVE  Hepatitis C Antibody   Collection Time: 03/05/24  2:06 PM  Result Value Ref Range   Hepatitis C Ab NON-REACTIVE NON-REACTIVE  CBC with Differential/Platelet   Collection Time: 03/05/24  2:06 PM  Result Value Ref Range   WBC 6.1 3.8 - 10.8 Thousand/uL   RBC 4.53 3.80 - 5.10 Million/uL   Hemoglobin 12.8 11.7 - 15.5 g/dL   HCT 60.3 64.9 - 54.9 %   MCV  87.4 80.0 - 100.0 fL   MCH 28.3 27.0 - 33.0 pg   MCHC 32.3 32.0 - 36.0 g/dL   RDW 86.4 88.9 - 84.9 %   Platelets 265 140 - 400 Thousand/uL   MPV 11.1 7.5 - 12.5 fL   Neutro Abs 2,855 1,500 - 7,800 cells/uL   Absolute Lymphocytes 2,690 850 - 3,900 cells/uL   Absolute Monocytes 390 200 - 950 cells/uL   Eosinophils Absolute 122 15 - 500 cells/uL   Basophils Absolute 43 0 - 200 cells/uL   Neutrophils Relative % 46.8 %   Total Lymphocyte 44.1 %   Monocytes Relative 6.4 %   Eosinophils Relative 2.0 %   Basophils Relative 0.7 %  Comprehensive metabolic panel with GFR   Collection Time: 03/05/24  2:06 PM  Result Value Ref Range   Glucose, Bld 109 (H) 65 - 99 mg/dL   BUN 8 7 - 25 mg/dL   Creat 9.38 9.49 - 8.94 mg/dL   eGFR 898 > OR = 60 fO/fpw/8.26f7   BUN/Creatinine Ratio SEE NOTE: 6 - 22 (calc)   Sodium 143 135 - 146 mmol/L   Potassium 4.3 3.5 - 5.3 mmol/L   Chloride 103 98 - 110 mmol/L   CO2 31 20 - 32 mmol/L   Calcium  9.9 8.6 - 10.4 mg/dL   Total Protein 7.3 6.1 - 8.1 g/dL   Albumin 4.6 3.6 - 5.1 g/dL   Globulin 2.7 1.9 - 3.7 g/dL (calc)   AG Ratio 1.7 1.0 - 2.5 (calc)   Total Bilirubin 0.4 0.2 - 1.2 mg/dL   Alkaline phosphatase (APISO) 77 37 - 153 U/L   AST 17 10 - 35 U/L   ALT 12 6 - 29 U/L  Lipid panel   Collection Time: 03/05/24  2:06 PM  Result Value Ref Range   Cholesterol 223 (H) <200 mg/dL   HDL 58 > OR = 50 mg/dL   Triglycerides 77 <849 mg/dL   LDL Cholesterol (Calc) 147 (H) mg/dL (calc)   Total CHOL/HDL Ratio 3.8 <5.0 (calc)   Non-HDL Cholesterol (Calc) 165 (H) <130 mg/dL (calc)  Hemoglobin J8r   Collection Time: 03/05/24  2:06 PM  Result Value Ref Range   Hgb A1c MFr Bld 5.8 (H) <5.7 %   Mean Plasma Glucose 120 mg/dL   eAG (mmol/L) 6.6 mmol/L  POCT urinalysis dipstick   Collection Time: 03/05/24  2:13 PM  Result Value Ref Range   Color, UA yellow    Clarity, UA clear    Glucose, UA Negative Negative   Bilirubin, UA neg    Ketones, UA neg    Spec Grav,  UA 1.015 1.010 - 1.025   Blood, UA neg    pH, UA 6.0 5.0 - 8.0   Protein, UA Negative Negative   Urobilinogen, UA 0.2 0.2 or 1.0 E.U./dL   Nitrite, UA neg  Leukocytes, UA Negative Negative   Appearance clear    Odor normal   Cologuard   Collection Time: 03/13/24  8:20 AM  Result Value Ref Range   COLOGUARD Negative Negative          Assessment & Plan:   Problem List Items Addressed This Visit       Cardiovascular and Mediastinum   Hypertension - Primary   Other Visit Diagnoses       Menopausal vasomotor syndrome            Assessment and Plan Assessment & Plan Hypertension Hypertension is well-controlled with current medication regimen. Blood pressure has improved from 140/82 mmHg to 122/84 mmHg after starting amlodipine  10 mg daily. - Continue amlodipine  10 mg daily - Return for follow-up in six months unless symptoms change or blood pressure increases  Menopausal symptoms Menopausal symptoms are well-managed with hormone replacement therapy (HRT). She reports improvement in symptoms and has adequate refills for HRT patches. - Continue current hormone replacement therapy - Ensure pharmacy fills HRT patches as needed  Knee pain Chronic knee pain managed with cortisone injections. She reports initial improvement but ongoing discomfort. Cortisone injections are typically administered every three months. - Administer cortisone injections every three months as needed - Await call regarding cane order - Consider adjustable knee brace to prevent slipping        Follow up plan: Return in about 6 months (around 10/03/2024) for follow up.

## 2024-04-21 NOTE — Progress Notes (Signed)
 Thank you for this referral. The [facility] team receives referrals for patients who meet Complex Care Management program criteria: chronic conditions including heart failure, stroke, COPD, ESRD, Sickle Cell, Diabetes with complications, Mental/Behavioral Health diagnosis, substance abuse/misuse and whose Primary Care Provider is a [facility] provider or [facility] contracted Building control surveyor in [place]).  Does not meet Complex Care Management program criteria.  Dreama [facility]  [facility], [facility] VBCI Assistant Direct Dial: [phone]  Fax: [phone]

## 2024-05-31 ENCOUNTER — Ambulatory Visit: Admitting: Urology

## 2024-07-05 ENCOUNTER — Ambulatory Visit: Admitting: Urology

## 2024-07-05 VITALS — BP 149/93 | HR 97 | Ht 64.0 in | Wt 210.0 lb

## 2024-07-05 DIAGNOSIS — N3946 Mixed incontinence: Secondary | ICD-10-CM

## 2024-07-05 LAB — URINALYSIS, COMPLETE
Bilirubin, UA: NEGATIVE
Glucose, UA: NEGATIVE
Ketones, UA: NEGATIVE
Leukocytes,UA: NEGATIVE
Nitrite, UA: NEGATIVE
Protein,UA: NEGATIVE
RBC, UA: NEGATIVE
Specific Gravity, UA: 1.03 (ref 1.005–1.030)
Urobilinogen, Ur: 1 mg/dL (ref 0.2–1.0)
pH, UA: 6 (ref 5.0–7.5)

## 2024-07-05 LAB — MICROSCOPIC EXAMINATION: Epithelial Cells (non renal): >10 /HPF — AB (ref 0–10)

## 2024-07-05 MED ORDER — GEMTESA 75 MG PO TABS: 75.0000 mg | ORAL_TABLET | Freq: Every day | ORAL | 11 refills | Status: AC

## 2024-07-05 MED ORDER — GEMTESA 75 MG PO TABS: 75.0000 mg | ORAL_TABLET | Freq: Every day | ORAL | Status: AC

## 2024-07-05 NOTE — Progress Notes (Signed)
 07/05/2024 10:46 AM   Corean ONEIDA Pinal Jul 10, 1961 [id number]  Referring provider: Gareth Mliss FALCON, FNP [address] [place],  [place] [address]  Chief Complaint  Patient presents with   Establish Care   Urinary Incontinence    HPI: I was comes also to assess the patient's urinary incontinence.  Primary symptom is urge incontinence.  If she does not get there in time she can soak 2 pads a day.  She sometimes has foot on the floor syndrome.  She can leak with bending lifting and laughing but not with coughing sneezing.  She voids every 2 or 3 hours gets up 4 times at night.  No ankle edema.  Flow was good  Has not had a hysterectomy  No history of kidney stones bladder surgery or bladder infections.  No neurologic issues.  No treatment   PMH: Past Medical History:  Diagnosis Date   Arthritis    Asthma    Hypertension    Plantar fasciitis     Surgical History: Past Surgical History:  Procedure Laterality Date   CESAREAN SECTION     JOINT REPLACEMENT     2 knee surgeries to right knee    Home Medications:  Allergies as of 07/05/2024       Reactions   Meloxicam  Rash        Medication List        Accurate as of July 05, 2024 10:46 AM. If you have any questions, ask your nurse or doctor.          amLODipine  10 MG tablet Commonly known as: NORVASC  Take 1 tablet (10 mg total) by mouth daily.   estradiol  0.025 MG/24HR Commonly known as: VIVELLE -DOT Place 1 patch onto the skin 2 (two) times a week.   progesterone  200 MG capsule Commonly known as: PROMETRIUM  Take 1 capsule (200 mg total) by mouth daily. Take 1 capsule at bedtime 12 days a month   rosuvastatin  5 MG tablet Commonly known as: Crestor  Take 1 tablet (5 mg total) by mouth daily.        Allergies:  Allergies  Allergen Reactions   Meloxicam  Rash    Family History: Family History  Problem Relation Age of Onset   Hypertension Mother    Diabetes Mother     Dementia Mother    Dementia Father     Social History:  reports that she has quit smoking. She has never used smokeless tobacco. She reports current drug use. Drug: Marijuana. She reports that she does not drink alcohol.  ROS:                                        Physical Exam: BP (!) 149/93   Pulse 97   Ht 5' 4 (1.626 m)   Wt 95.3 kg   LMP 11/10/2017 (Approximate)   BMI 36.05 kg/m   Constitutional:  Alert and oriented, No acute distress. HEENT: [place] AT, moist mucus membranes.  Trachea midline, no masses.   Laboratory Data: Lab Results  Component Value Date   WBC 6.1 03/05/2024   HGB 12.8 03/05/2024   HCT 39.6 03/05/2024   MCV 87.4 03/05/2024   PLT 265 03/05/2024    Lab Results  Component Value Date   CREATININE 0.61 03/05/2024    No results found for: PSA  No results found for: TESTOSTERONE  Lab Results  Component Value Date  HGBA1C 5.8 (H) 03/05/2024    Urinalysis    Component Value Date/Time   COLORURINE YELLOW (A) 07/23/2015 1214   APPEARANCEUR CLEAR (A) 07/23/2015 1214   LABSPEC 1.023 07/23/2015 1214   PHURINE 6.0 07/23/2015 1214   GLUCOSEU NEGATIVE 07/23/2015 1214   HGBUR NEGATIVE 07/23/2015 1214   BILIRUBINUR neg 03/05/2024 1413   KETONESUR NEGATIVE 07/23/2015 1214   PROTEINUR Negative 03/05/2024 1413   PROTEINUR NEGATIVE 07/23/2015 1214   UROBILINOGEN 0.2 03/05/2024 1413   NITRITE neg 03/05/2024 1413   NITRITE NEGATIVE 07/23/2015 1214   LEUKOCYTESUR Negative 03/05/2024 1413    Pertinent Imaging: Urine reviewed and sent for culture.  Chart reviewed  Assessment & Plan: Patient has mixed incontinence but primarily urge incontinence.  Reassess in 6 weeks for pelvic examination cystoscopy on Gemtesa  samples and prescription and proceed accordingly.  I mention urodynamics without detail and I may or may not order it in the future  1. Mixed incontinence (Primary)  - Urinalysis, Complete   No follow-ups on  file.  Glendia DELENA Elizabeth, MD  [facility] [address], [address] [place], [place] [address] [phone]

## 2024-07-05 NOTE — Patient Instructions (Signed)

## 2024-07-30 ENCOUNTER — Telehealth: Payer: Self-pay

## 2024-07-30 DIAGNOSIS — N3946 Mixed incontinence: Secondary | ICD-10-CM

## 2024-07-30 MED ORDER — MIRABEGRON ER 50 MG PO TB24: 50.0000 mg | ORAL_TABLET | Freq: Every day | ORAL | 11 refills | Status: AC

## 2024-07-30 NOTE — Telephone Encounter (Signed)
 See separate encounter

## 2024-07-30 NOTE — Telephone Encounter (Signed)
 Called patient to inform her that the prior authorization for Gemtesa  were denied. Patient was informed that a prescription for Myrbetriq  has been sent to her pharmacy. She was instructed to notify our office if the medication does not improve her symptoms so we can discuss next steps. Patient voiced understanding.

## 2024-08-09 ENCOUNTER — Ambulatory Visit: Admitting: Urology

## 2024-09-27 ENCOUNTER — Other Ambulatory Visit: Admitting: Urology

## 2024-10-04 ENCOUNTER — Ambulatory Visit: Admitting: Nurse Practitioner
